# Patient Record
Sex: Male | Born: 1975
Health system: Southern US, Community
[De-identification: ages and names within clinical notes are randomized; demographics above are authoritative.]

## PROBLEM LIST (undated history)

## (undated) DIAGNOSIS — N200 Calculus of kidney: Secondary | ICD-10-CM

## (undated) DIAGNOSIS — G4730 Sleep apnea, unspecified: Secondary | ICD-10-CM

## (undated) DIAGNOSIS — F419 Anxiety disorder, unspecified: Secondary | ICD-10-CM

## (undated) HISTORY — DX: Anxiety disorder, unspecified: F41.9

## (undated) HISTORY — DX: Sleep apnea, unspecified: G47.30

## (undated) HISTORY — PX: ELBOW SURGERY: SHX618

---
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
## 1998-10-12 ENCOUNTER — Emergency Department (HOSPITAL_COMMUNITY): Admission: EM | Admit: 1998-10-12 | Discharge: 1998-10-12 | Payer: Self-pay | Admitting: Emergency Medicine

## 2000-03-28 ENCOUNTER — Emergency Department (HOSPITAL_COMMUNITY): Admission: EM | Admit: 2000-03-28 | Discharge: 2000-03-28 | Payer: Self-pay | Admitting: Emergency Medicine

## 2000-03-28 ENCOUNTER — Encounter: Payer: Self-pay | Admitting: Emergency Medicine

## 2000-11-01 ENCOUNTER — Encounter: Payer: Self-pay | Admitting: Gastroenterology

## 2000-11-01 ENCOUNTER — Encounter: Admission: RE | Admit: 2000-11-01 | Discharge: 2000-11-01 | Payer: Self-pay | Admitting: Gastroenterology

## 2001-07-18 ENCOUNTER — Emergency Department (HOSPITAL_COMMUNITY): Admission: EM | Admit: 2001-07-18 | Discharge: 2001-07-18 | Payer: Self-pay

## 2010-01-30 ENCOUNTER — Ambulatory Visit: Payer: Self-pay | Admitting: Diagnostic Radiology

## 2010-01-30 ENCOUNTER — Emergency Department (HOSPITAL_BASED_OUTPATIENT_CLINIC_OR_DEPARTMENT_OTHER): Admission: EM | Admit: 2010-01-30 | Discharge: 2010-01-30 | Payer: Self-pay | Admitting: Emergency Medicine

## 2011-03-24 ENCOUNTER — Other Ambulatory Visit: Payer: Self-pay | Admitting: Family Medicine

## 2011-03-24 DIAGNOSIS — M542 Cervicalgia: Secondary | ICD-10-CM

## 2011-03-25 ENCOUNTER — Ambulatory Visit
Admission: RE | Admit: 2011-03-25 | Discharge: 2011-03-25 | Disposition: A | Discharge status: Home / Self Care | Payer: 59 | Source: Ambulatory Visit | Attending: Family Medicine | Admitting: Family Medicine

## 2011-03-25 DIAGNOSIS — M542 Cervicalgia: Secondary | ICD-10-CM

## 2017-01-11 ENCOUNTER — Ambulatory Visit (INDEPENDENT_AMBULATORY_CARE_PROVIDER_SITE_OTHER): Payer: Commercial Managed Care - HMO | Admitting: Family Medicine

## 2017-01-11 ENCOUNTER — Encounter: Payer: Self-pay | Admitting: Family Medicine

## 2017-01-11 VITALS — BP 126/88 | HR 105 | Temp 97.4°F | Ht 67.5 in | Wt 198.0 lb

## 2017-01-11 DIAGNOSIS — R079 Chest pain, unspecified: Secondary | ICD-10-CM | POA: Diagnosis not present

## 2017-01-11 MED ORDER — PAROXETINE HCL 40 MG PO TABS: 40.0000 mg | ORAL_TABLET | Freq: Every day | ORAL | 3 refills | Status: DC

## 2017-01-11 NOTE — Progress Notes (Signed)
Subjective:  Patient ID: Isaac [name], male    DOB: 1976/10/22  Age: 41 y.o. MRN: [id number]  CC: New Patient (Initial Visit) (pt here today to establish care and c/o one episode of chest heaviness that lasted about 10 hours and then hasn't occured since then, but had SOB yesterday.)   HPI Isaac [name] presents for Two days ago had 8 hour episode of substernal chest pain. Described as 3/10. Pressure. No radiation to shoulder, neck or arm. . No nausea or dyspnea. 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. Job stress related to dysfunctional personality of coworker. Mild dyspnea intermitttently yesterday. Not related to exertion. Vague intermittent sensation in central chest today. Different from past episodes of heartburn.  History Isaac [name] has a past medical history of Anxiety.   He has a past surgical history that includes Wisdom tooth extraction (1995).   His family history includes Anxiety disorder in his daughter; Asthma in his brother and daughter; Diabetes in his father, paternal grandfather, and paternal grandmother; Hearing loss in his maternal grandfather; Heart disease in his father, paternal grandfather, and paternal grandmother; Hyperlipidemia in his father, paternal grandfather, and paternal grandmother; Hypertension in his father, paternal grandfather, and paternal grandmother.He reports that he quit smoking about 10 years ago. His smokeless tobacco use includes Chew. He reports that he does not drink alcohol or use drugs.  No current outpatient prescriptions on file prior to visit.   No current facility-administered medications on file prior to visit.     ROS Review of Systems  Constitutional: Negative for chills, diaphoresis, fever and unexpected weight change.  HENT: Negative for congestion, hearing loss, rhinorrhea and sore throat.   Eyes: Negative for visual disturbance.  Respiratory: Negative for cough and shortness of  breath.   Cardiovascular: Positive for chest pain.  Gastrointestinal: Negative for abdominal pain, constipation and diarrhea.  Genitourinary: Negative for dysuria and flank pain.  Musculoskeletal: Negative for arthralgias and joint swelling.  Skin: Negative for rash.  Neurological: Negative for dizziness and headaches.  Psychiatric/Behavioral: Negative for dysphoric mood and sleep disturbance.    Objective:  BP 126/88   Pulse (!) 105   Temp 97.4 F (36.3 C) (Oral)   Ht 5' 7.5" (1.715 m)   Wt 198 lb (89.8 kg)   BMI 30.55 kg/m   Physical Exam  Constitutional: He is oriented to person, place, and time. He appears well-developed and well-nourished. No distress.  HENT:  Head: Normocephalic and atraumatic.  Right Ear: External ear normal.  Left Ear: External ear normal.  Nose: Nose normal.  Mouth/Throat: Oropharynx is clear and moist.  Eyes: Conjunctivae and EOM are normal. Pupils are equal, round, and reactive to light.  Neck: Normal range of motion. Neck supple. No thyromegaly present.  Cardiovascular: Normal rate, regular rhythm and normal heart sounds.   No murmur heard. Pulmonary/Chest: Effort normal and breath sounds normal. No respiratory distress. He has no wheezes. He has no rales.  Abdominal: Soft. Bowel sounds are normal. He exhibits no distension. There is no tenderness.  Lymphadenopathy:    He has no cervical adenopathy.  Neurological: He is [name] and oriented to person, place, and time. He has normal reflexes.  Skin: Skin is warm and dry.  Psychiatric: He has a normal mood and affect. His behavior is normal. Judgment and thought content normal.    Assessment & Plan:   Isaac [name] was seen today for new patient (initial visit).  Diagnoses and all  orders for this visit:  Chest pain, unspecified type -     EKG 12-Lead -     CBC with Differential/Platelet -     CMP14+EGFR -     D-dimer, quantitative (not at [facility]) -     Lipid panel -     US Abdomen Complete;  Future -     ECHOCARDIOGRAM STRESS TEST; Future  Other orders -     PARoxetine (PAXIL) 40 MG tablet; Take 1 tablet (40 mg total) by mouth daily.   I have changed Isaac [name] PARoxetine.  Meds ordered this encounter  Medications  . DISCONTD: PARoxetine (PAXIL) 40 MG tablet    Sig: Take 40 mg by mouth daily.  Marland Kitchen PARoxetine (PAXIL) 40 MG tablet    Sig: Take 1 tablet (40 mg total) by mouth daily.    Dispense:  90 tablet    Refill:  3   EKG - NSR, no ischemia  Follow-up: Return in about 6 weeks (around 02/22/2017).  Claretta Fraise, M.D.

## 2017-01-12 LAB — CMP14+EGFR
ALBUMIN: 4.7 g/dL (ref 3.5–5.5)
ALT: 25 IU/L (ref 0–44)
AST: 21 IU/L (ref 0–40)
Albumin/Globulin Ratio: 1.9 (ref 1.2–2.2)
Alkaline Phosphatase: 88 IU/L (ref 39–117)
BUN / CREAT RATIO: 13 (ref 9–20)
BUN: 14 mg/dL (ref 6–24)
Bilirubin Total: 0.5 mg/dL (ref 0.0–1.2)
CALCIUM: 9.4 mg/dL (ref 8.7–10.2)
CO2: 24 mmol/L (ref 18–29)
CREATININE: 1.05 mg/dL (ref 0.76–1.27)
Chloride: 99 mmol/L (ref 96–106)
GFR calc Af Amer: 102 mL/min/{1.73_m2} (ref >59)
GFR, EST NON AFRICAN AMERICAN: 88 mL/min/{1.73_m2} (ref >59)
GLUCOSE: 74 mg/dL (ref 65–99)
Globulin, Total: 2.5 g/dL (ref 1.5–4.5)
Potassium: 4.8 mmol/L (ref 3.5–5.2)
Sodium: 141 mmol/L (ref 134–144)
Total Protein: 7.2 g/dL (ref 6.0–8.5)

## 2017-01-12 LAB — CBC WITH DIFFERENTIAL/PLATELET
BASOS ABS: 0.1 10*3/uL (ref 0.0–0.2)
Basos: 1 %
EOS (ABSOLUTE): 0.1 10*3/uL (ref 0.0–0.4)
EOS: 1 %
HEMATOCRIT: 45.5 % (ref 37.5–51.0)
HEMOGLOBIN: 15.8 g/dL (ref 13.0–17.7)
IMMATURE GRANULOCYTES: 0 %
Immature Grans (Abs): 0 10*3/uL (ref 0.0–0.1)
LYMPHS ABS: 2.2 10*3/uL (ref 0.7–3.1)
LYMPHS: 26 %
MCH: 30.5 pg (ref 26.6–33.0)
MCHC: 34.7 g/dL (ref 31.5–35.7)
MCV: 88 fL (ref 79–97)
MONOCYTES: 7 %
Monocytes Absolute: 0.6 10*3/uL (ref 0.1–0.9)
NEUTROS PCT: 65 %
Neutrophils Absolute: 5.4 10*3/uL (ref 1.4–7.0)
Platelets: 300 10*3/uL (ref 150–379)
RBC: 5.18 x10E6/uL (ref 4.14–5.80)
RDW: 13.1 % (ref 12.3–15.4)
WBC: 8.3 10*3/uL (ref 3.4–10.8)

## 2017-01-12 LAB — LIPID PANEL
CHOLESTEROL TOTAL: 214 mg/dL — AB (ref 100–199)
Chol/HDL Ratio: 6.5 ratio units — ABNORMAL HIGH (ref 0.0–5.0)
HDL: 33 mg/dL — AB (ref >39)
Triglycerides: 487 mg/dL — ABNORMAL HIGH (ref 0–149)

## 2017-01-12 LAB — D-DIMER, QUANTITATIVE (NOT AT ARMC): D-DIMER: 0.24 mg{FEU}/L (ref 0.00–0.49)

## 2017-01-13 ENCOUNTER — Telehealth: Payer: Self-pay | Admitting: Family Medicine

## 2017-01-13 DIAGNOSIS — R079 Chest pain, unspecified: Secondary | ICD-10-CM

## 2017-01-18 ENCOUNTER — Other Ambulatory Visit: Payer: Self-pay | Admitting: Family Medicine

## 2017-01-18 NOTE — Telephone Encounter (Signed)
There is an order in the system from Jan 31 for a stress echocardiogram. WS

## 2017-01-18 NOTE — Telephone Encounter (Signed)
Ca n you schedule this please?

## 2017-01-20 ENCOUNTER — Ambulatory Visit (HOSPITAL_COMMUNITY): Payer: 59

## 2017-01-31 ENCOUNTER — Ambulatory Visit: Payer: 59 | Admitting: Cardiology

## 2017-02-01 ENCOUNTER — Ambulatory Visit (INDEPENDENT_AMBULATORY_CARE_PROVIDER_SITE_OTHER): Payer: Commercial Managed Care - HMO | Admitting: Family Medicine

## 2017-02-01 ENCOUNTER — Encounter: Payer: Self-pay | Admitting: Family Medicine

## 2017-02-01 VITALS — BP 116/80 | HR 95 | Temp 98.5°F | Ht 67.5 in | Wt 201.0 lb

## 2017-02-01 DIAGNOSIS — R079 Chest pain, unspecified: Secondary | ICD-10-CM

## 2017-02-01 NOTE — Progress Notes (Signed)
Subjective:  Patient ID: Isaac [name], male    DOB: 1976-01-09  Age: 41 y.o. MRN: [id number]  CC: Follow-up (pt here today following up on chest pain. Pt states he hasn't had any episodes since he was seen last and no other concerns voiced at this time.)   HPI COLA HIGHFILL presents for Completely symptom-free since he was here last time. He still has some stressful colleagues at work. He had a great trip to [place] with his family. He admits to being impatient at times based on the long waits and lines of amusement parks. Is just not his style to do that fortunately there has been no chest pain, no palpitations, no dyspnea. No syncope. The abdomen has been benign as well. He decided against the gallbladder ultrasound. He is not having excessive flatulence or eructation.   History Hairo has a past medical history of Anxiety.   He has a past surgical history that includes Wisdom tooth extraction (1995).   His family history includes Anxiety disorder in his daughter; Asthma in his brother and daughter; Diabetes in his father, paternal grandfather, and paternal grandmother; Hearing loss in his maternal grandfather; Heart disease in his father, paternal grandfather, and paternal grandmother; Hyperlipidemia in his father, paternal grandfather, and paternal grandmother; Hypertension in his father, paternal grandfather, and paternal grandmother.He reports that he quit smoking about 10 years ago. His smokeless tobacco use includes Chew. He reports that he does not drink alcohol or use drugs.    ROS Review of Systems  Constitutional: Negative for chills, diaphoresis and fever.  HENT: Negative for rhinorrhea and sore throat.   Respiratory: Negative for cough and shortness of breath.   Cardiovascular: Negative for chest pain.  Gastrointestinal: Negative for abdominal pain.  Musculoskeletal: Negative for arthralgias and myalgias.  Skin: Negative for rash.  Neurological: Negative for weakness and  headaches.    Objective:  BP 116/80   Pulse 95   Temp 98.5 F (36.9 C) (Oral)   Ht 5' 7.5" (1.715 m)   Wt 201 lb (91.2 kg)   BMI 31.02 kg/m   BP Readings from Last 3 Encounters:  02/01/17 116/80  01/11/17 126/88    Wt Readings from Last 3 Encounters:  02/01/17 201 lb (91.2 kg)  01/11/17 198 lb (89.8 kg)     Physical Exam  Constitutional: He appears well-developed and well-nourished.  HENT:  Head: Normocephalic and atraumatic.  Right Ear: Tympanic membrane and external ear normal. No decreased hearing is noted.  Left Ear: Tympanic membrane and external ear normal. No decreased hearing is noted.  Mouth/Throat: No oropharyngeal exudate or posterior oropharyngeal erythema.  Eyes: Pupils are equal, round, and reactive to light.  Neck: Normal range of motion. Neck supple.  Cardiovascular: Normal rate and regular rhythm.   No murmur heard. Pulmonary/Chest: Breath sounds normal. No respiratory distress.  Abdominal: Soft. Bowel sounds are normal. He exhibits no mass. There is no tenderness.  Vitals reviewed.   Mr Cervical Spine Wo Contrast  Result Date: 03/25/2011 *RADIOLOGY REPORT* Clinical Data: Neck pain extending into both shoulders and into the right arm.  Numbness of the fourth and fifth fingers. MRI CERVICAL SPINE WITHOUT CONTRAST Technique:  Multiplanar and multiecho pulse sequences of the cervical spine, to include the craniocervFollow-up as needed for chest pain otherwise annually.ical junction and cervicothoracic junction, were obtained according to standard protocol without intravenous contrast. Comparison: None Findings: Alignment is normal.  There is no abnormality at the foramen magnum, C1-2, C2-3 or C3-4. At  C4-5, the disc bulges mildly.  The canal and foramina appear widely patent. At C5-6, the disc bulges mildly.  The canal and foramina appear widely patent. At C6-7, the disc bulges mildly.  The canal and foramina are widely patent. C7-T1 and T1-2 are normal. No  abnormal cord signal.  No focal osseous or articular pathology. IMPRESSION: No advanced disease or cause of the patient's described symptoms is identified.  Minimal non compressive disc bulges at C4-5, C5-6 and C6-7. Original Report Authenticated By: Thomasenia SalesMARK E. SHOGRY, M.D.   Assessment & Plan:   Duanne GuessDewey was seen today for follow-up.  Diagnoses and all orders for this visit:  Chest pain, unspecified type    I am having Mr. Katrinka BlazingSmith maintain his PARoxetine.  Allergies as of 02/01/2017   No Known Allergies     Medication List       Accurate as of 02/01/17 11:59 PM. Always use your most recent med list.          PARoxetine 40 MG tablet Commonly known as:  PAXIL Take 1 tablet (40 mg total) by mouth daily.        Follow-up: Return in about 1 year (around 02/01/2018).  Mechele ClaudeWarren Dalvin Clipper, M.D.

## 2017-02-10 ENCOUNTER — Encounter: Payer: Self-pay | Admitting: Cardiology

## 2017-02-10 ENCOUNTER — Ambulatory Visit (INDEPENDENT_AMBULATORY_CARE_PROVIDER_SITE_OTHER): Payer: Commercial Managed Care - HMO | Admitting: Cardiology

## 2017-02-10 VITALS — BP 116/96 | HR 95 | Ht 67.0 in | Wt 197.6 lb

## 2017-02-10 DIAGNOSIS — R079 Chest pain, unspecified: Secondary | ICD-10-CM | POA: Diagnosis not present

## 2017-02-10 NOTE — Patient Instructions (Signed)
Medication Instructions:  Your physician recommends that you continue on your current medications as directed. Please refer to the Current Medication list given to you today.  * If you need a refill on your cardiac medications before your next appointment, please call your pharmacy.   Labwork: None ordered  Testing/Procedures: None ordered  Follow-Up: No follow up is needed at this time with Dr. Camnitz.  He will see you on an as needed basis.   Thank you for choosing [facility]!!   Jaquis Picklesimer, RN [phone]     

## 2017-02-10 NOTE — Progress Notes (Signed)
Electrophysiology Office Note   Date:  02/10/2017   ID:  [name], DOB [birth year], MRN [id number]  PCP:  Mechele Claude, MD Primary Electrophysiologist:  Regan Lemming, MD    Chief Complaint  Patient presents with  . New Patient (Initial Visit)    chest pain     History of Present Illness: KAIRI TUFO is a 41 y.o. male who presents today for electrophysiology evaluation.   He presented to the primary physician at the end of January with chest pain. His chest heaviness lasted about 10 hours. It was 3 out of 10 without radiation of the shoulder neck or arm. In follow-up to his primary physician's office, he had not had any further episodes of chest pain. 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 He attributes this to a panic attack. He has not had any further episodes of shortness of breath or chest pain. He says that he is working outside on his land doing yard work, and chopping down trees. He is a well with that without any further issues.none   Today, he denies symptoms of palpitations, chest pain, shortness of breath, orthopnea, PND, lower extremity edema, claudication, dizziness, presyncope, syncope, bleeding, or neurologic sequela. The patient is tolerating medications without difficulties and is otherwise without complaint today.    Past Medical History:  Diagnosis Date  . Anxiety    Past Surgical History:  Procedure Laterality Date  . WISDOM TOOTH EXTRACTION  1995     Current Outpatient Prescriptions  Medication Sig Dispense Refill  . PARoxetine (PAXIL) 40 MG tablet Take 1 tablet (40 mg total) by mouth daily. 90 tablet 3   No current facility-administered medications for this visit.     Allergies:   Patient has no known allergies.   Social History:  The patient  reports that he quit smoking about 10 years ago. His smokeless tobacco use includes Chew. He reports  that he does not drink alcohol or use drugs.   Family History:  The patient's family history includes Anxiety disorder in his daughter; Asthma in his brother and daughter; Diabetes in his father, paternal grandfather, and paternal grandmother; Hearing loss in his maternal grandfather; Heart disease in his father, paternal grandfather, and paternal grandmother; Hyperlipidemia in his father, paternal grandfather, and paternal grandmother; Hypertension in his father, paternal grandfather, and paternal grandmother.    ROS:  Please see the history of present illness.   Otherwise, review of systems is positive for none.   All other systems are reviewed and negative.    PHYSICAL EXAM: VS:  BP (!) 116/96   Pulse 95   Ht 5\' 7"  (1.702 m)   Wt 197 lb 9.6 oz (89.6 kg)   BMI 30.95 kg/m  , BMI Body mass index is 30.95 kg/m. GEN: Well nourished, well developed, in no acute distress  HEENT: normal  Neck: no JVD, carotid bruits, or masses Cardiac: RRR; no murmurs, rubs, or gallops,no edema  Respiratory:  clear to auscultation bilaterally, normal work of breathing GI: soft, nontender, nondistended, + BS MS: no deformity or atrophy  Skin: warm and dry Neuro:  Strength and sensation are intact Psych: euthymic mood, full affect  EKG:  EKG is ordered today. Personal review of the ekg ordered shows sinus rhythm, rate 95  Recent Labs: 01/11/2017: ALT 25; BUN 14; Creatinine, Ser 1.05; Platelets 300; Potassium 4.8; Sodium 141  Lipid Panel     Component Value Date/Time   CHOL 214 (H) 01/11/2017 1750   TRIG 487 (H) 01/11/2017 1750   HDL 33 (L) 01/11/2017 1750   CHOLHDL 6.5 (H) 01/11/2017 1750   LDLCALC Comment 01/11/2017 1750     Wt Readings from Last 3 Encounters:  02/10/17 197 lb 9.6 oz (89.6 kg)  02/01/17 201 lb (91.2 kg)  01/11/17 198 lb (89.8 kg)      Other studies Reviewed: Additional studies/ records that were reviewed today include: PCP notes     ASSESSMENT AND PLAN:  1.   Chest pain: He is chest pain sounds atypical in nature. It was not associated with activity, and lasted multiple hours. He did have an episode of shortness of breath which she attributes to anxiety. He has exerted himself doing yard work, and chopping down trees which also did not cause any episodes of chest pain. Due to that, we'll not order further testing at this time. I have told him that if he develops chest pain again to call our office and we Patricie Geeslin order stress testing at that point.     Current medicines are reviewed at length with the patient today.   The patient does not have concerns regarding his medicines.  The following changes were made today:  none  Labs/ tests ordered today include:  Orders Placed This Encounter  Procedures  . EKG 12-Lead     Disposition:   FU with Conroy Goracke PRN  Signed, Kallen Delatorre Jorja LoaMartin Silvano Garofano, MD  02/10/2017 3:17 PM     [facility] [address] [place] [id number] [phone] (office) [phone] (fax)

## 2017-03-08 ENCOUNTER — Telehealth (HOSPITAL_COMMUNITY): Payer: Self-pay | Admitting: Family Medicine

## 2017-03-08 NOTE — Telephone Encounter (Signed)
Per office note by Lynnea FerrierWarren Pickard on 02/02/16  CC: Follow-up (pt here today following up on chest pain. Pt states he hasn't had any episodes since he was seen last and no other concerns voiced at this time.)

## 2017-03-18 ENCOUNTER — Emergency Department (HOSPITAL_BASED_OUTPATIENT_CLINIC_OR_DEPARTMENT_OTHER)
Admission: EM | Admit: 2017-03-18 | Discharge: 2017-03-18 | Disposition: A | Discharge status: Home / Self Care | Payer: Commercial Managed Care - HMO | Attending: Emergency Medicine | Admitting: Emergency Medicine

## 2017-03-18 ENCOUNTER — Emergency Department (HOSPITAL_BASED_OUTPATIENT_CLINIC_OR_DEPARTMENT_OTHER): Payer: Commercial Managed Care - HMO

## 2017-03-18 ENCOUNTER — Encounter (HOSPITAL_BASED_OUTPATIENT_CLINIC_OR_DEPARTMENT_OTHER): Payer: Self-pay | Admitting: *Deleted

## 2017-03-18 DIAGNOSIS — N201 Calculus of ureter: Secondary | ICD-10-CM | POA: Diagnosis not present

## 2017-03-18 DIAGNOSIS — N200 Calculus of kidney: Secondary | ICD-10-CM

## 2017-03-18 DIAGNOSIS — R109 Unspecified abdominal pain: Secondary | ICD-10-CM | POA: Diagnosis not present

## 2017-03-18 DIAGNOSIS — Z79899 Other long term (current) drug therapy: Secondary | ICD-10-CM | POA: Diagnosis not present

## 2017-03-18 DIAGNOSIS — F17220 Nicotine dependence, chewing tobacco, uncomplicated: Secondary | ICD-10-CM | POA: Insufficient documentation

## 2017-03-18 HISTORY — DX: Calculus of kidney: N20.0

## 2017-03-18 LAB — CBC WITH DIFFERENTIAL/PLATELET
Basophils Absolute: 0.1 10*3/uL (ref 0.0–0.1)
Basophils Relative: 1 %
EOS PCT: 1 %
Eosinophils Absolute: 0.1 10*3/uL (ref 0.0–0.7)
HCT: 45.6 % (ref 39.0–52.0)
Hemoglobin: 15.8 g/dL (ref 13.0–17.0)
LYMPHS ABS: 2.3 10*3/uL (ref 0.7–4.0)
LYMPHS PCT: 28 %
MCH: 30.7 pg (ref 26.0–34.0)
MCHC: 34.6 g/dL (ref 30.0–36.0)
MCV: 88.5 fL (ref 78.0–100.0)
MONOS PCT: 7 %
Monocytes Absolute: 0.6 10*3/uL (ref 0.1–1.0)
Neutro Abs: 5.1 10*3/uL (ref 1.7–7.7)
Neutrophils Relative %: 63 %
PLATELETS: 295 10*3/uL (ref 150–400)
RBC: 5.15 MIL/uL (ref 4.22–5.81)
RDW: 12.5 % (ref 11.5–15.5)
WBC: 8.2 10*3/uL (ref 4.0–10.5)

## 2017-03-18 LAB — URINALYSIS, ROUTINE W REFLEX MICROSCOPIC
Bilirubin Urine: NEGATIVE
GLUCOSE, UA: NEGATIVE mg/dL
Ketones, ur: NEGATIVE mg/dL
Leukocytes, UA: NEGATIVE
Nitrite: NEGATIVE
Protein, ur: 30 mg/dL — AB
SPECIFIC GRAVITY, URINE: 1.022 (ref 1.005–1.030)
pH: 6 (ref 5.0–8.0)

## 2017-03-18 LAB — BASIC METABOLIC PANEL
Anion gap: 12 (ref 5–15)
BUN: 15 mg/dL (ref 6–20)
CHLORIDE: 101 mmol/L (ref 101–111)
CO2: 26 mmol/L (ref 22–32)
Calcium: 9.3 mg/dL (ref 8.9–10.3)
Creatinine, Ser: 1.09 mg/dL (ref 0.61–1.24)
GFR calc Af Amer: >60 mL/min (ref >60)
GLUCOSE: 125 mg/dL — AB (ref 65–99)
POTASSIUM: 3.7 mmol/L (ref 3.5–5.1)
Sodium: 139 mmol/L (ref 135–145)

## 2017-03-18 LAB — URINALYSIS, MICROSCOPIC (REFLEX)

## 2017-03-18 MED ORDER — MORPHINE SULFATE (PF) 2 MG/ML IV SOLN
INTRAVENOUS | Status: AC
  Administered 2017-03-18: 2 mg via INTRAVENOUS
  Filled 2017-03-18: qty 1

## 2017-03-18 MED ORDER — ONDANSETRON 4 MG PO TBDP: ORAL_TABLET | ORAL | 0 refills | Status: DC

## 2017-03-18 MED ORDER — TAMSULOSIN HCL 0.4 MG PO CAPS: 0.4000 mg | ORAL_CAPSULE | Freq: Every day | ORAL | 0 refills | Status: DC

## 2017-03-18 MED ORDER — KETOROLAC TROMETHAMINE 30 MG/ML IJ SOLN
INTRAMUSCULAR | Status: AC
  Administered 2017-03-18: 30 mg
  Filled 2017-03-18: qty 1

## 2017-03-18 MED ORDER — MORPHINE SULFATE (PF) 4 MG/ML IV SOLN
INTRAVENOUS | Status: AC
Start: 2017-03-18 — End: 2017-03-18
  Administered 2017-03-18: 4 mg
  Filled 2017-03-18: qty 1

## 2017-03-18 MED ORDER — OXYCODONE-ACETAMINOPHEN 5-325 MG PO TABS: 1.0000 | ORAL_TABLET | ORAL | 0 refills | Status: DC | PRN

## 2017-03-18 MED ORDER — ONDANSETRON HCL 4 MG/2ML IJ SOLN
INTRAMUSCULAR | Status: AC
  Filled 2017-03-18: qty 2

## 2017-03-18 MED ORDER — ONDANSETRON HCL 4 MG/2ML IJ SOLN
4.0000 mg | Freq: Once | INTRAMUSCULAR | Status: AC
  Administered 2017-03-18: 4 mg via INTRAVENOUS

## 2017-03-18 NOTE — ED Triage Notes (Signed)
Pt reports sudden onset of L flank pain around 1600 (hx of kidney stones). Also reports n/v (approx 6 episodes). Denies known fever.

## 2017-03-18 NOTE — ED Notes (Signed)
Pt has hx of kidney stone and states this feels the same. Pain left flank onset 1 hr ago. Pain radiates to LLQ. +nausea. Vomited x 1.

## 2017-03-18 NOTE — ED Provider Notes (Signed)
[facility] Provider Note   CSN: [id number] Arrival date & time: 03/18/17  1633  By signing my name below, I, Nelwyn Salisbury, attest that this documentation has been prepared under the direction and in the presence of Rolan Bucco, MD . Electronically Signed: Nelwyn Salisbury, Scribe. 03/18/2017. 5:10 PM.  History   Chief Complaint Chief Complaint  Patient presents with  . Flank Pain   The history is provided by the patient and the spouse. No language interpreter was used.    HPI Comments:  Isaac [name] is a 41 y.o. male with pmhx of kidney stones who presents to the Emergency Department complaining of constant, moderate, left-flank pain onset 1 hour. Pt's wife states that the pt was complaining of pain that radiated into his abdomen and around to his back. Pt actively vomiting in room. He has not tried anything PTA for his symptoms. Denies any fever or difficulty urinating. Pt's last kidney stone was several years ago, and he typically passes them on his own.   Past Medical History:  Diagnosis Date  . Anxiety   . Kidney stones     There are no active problems to display for this patient.   Past Surgical History:  Procedure Laterality Date  . WISDOM TOOTH EXTRACTION  1995       Home Medications    Prior to Admission medications   Medication Sig Start Date End Date Taking? Authorizing Provider  ondansetron (ZOFRAN ODT) 4 MG disintegrating tablet  ODT q4 hours prn nausea/vomit 03/18/17   Rolan Bucco, MD  oxyCODONE-acetaminophen (PERCOCET) 5-325 MG tablet Take 1-2 tablets by mouth every 4 (four) hours as needed. 03/18/17   Rolan Bucco, MD  PARoxetine (PAXIL) 40 MG tablet Take 1 tablet (40 mg total) by mouth daily. 01/11/17   Mechele Claude, MD  tamsulosin (FLOMAX) 0.4 MG CAPS capsule Take 1 capsule (0.4 mg total) by mouth daily. 03/18/17   Rolan Bucco, MD    Family History Family History  Problem Relation Age of Onset  . Heart disease Father   . Hyperlipidemia  Father   . Diabetes Father   . Hypertension Father   . Asthma Brother   . Asthma Daughter   . Anxiety disorder Daughter   . Hearing loss Maternal Grandfather   . Diabetes Paternal Grandmother   . Heart disease Paternal Grandmother   . Hyperlipidemia Paternal Grandmother   . Hypertension Paternal Grandmother   . Heart disease Paternal Grandfather   . Hyperlipidemia Paternal Grandfather   . Diabetes Paternal Grandfather   . Hypertension Paternal Grandfather     Social History Social History  Substance Use Topics  . Smoking status: Former Smoker    Quit date: 01/11/2007  . Smokeless tobacco: Current User    Types: Chew  . Alcohol use No     Allergies   Patient has no known allergies.   Review of Systems Review of Systems  Constitutional: Negative for chills, diaphoresis, fatigue and fever.  HENT: Negative for congestion, rhinorrhea and sneezing.   Eyes: Negative.   Respiratory: Negative for cough, chest tightness and shortness of breath.   Cardiovascular: Negative for chest pain and leg swelling.  Gastrointestinal: Positive for abdominal pain, nausea and vomiting. Negative for blood in stool and diarrhea.  Genitourinary: Positive for flank pain. Negative for difficulty urinating, frequency and hematuria.  Musculoskeletal: Positive for back pain. Negative for arthralgias.  Skin: Negative for rash.  Neurological: Negative for dizziness, speech difficulty, weakness, numbness and headaches.  Physical Exam Updated Vital Signs BP (!) 128/95 (BP Location: Right Arm)   Pulse 96   Temp 97.4 F (36.3 C) (Oral)   Resp 20   Ht  (1.702 m)   Wt 200 lb (90.7 kg)   SpO2 99%   BMI 31.32 kg/m   Physical Exam  Constitutional: He is oriented to person, place, and time. He appears well-developed and well-nourished. He appears distressed.  HENT:  Head: Normocephalic and atraumatic.  Eyes: Pupils are equal, round, and reactive to light.  Neck: Normal range of motion.  Neck supple.  Cardiovascular: Normal rate, regular rhythm and normal heart sounds.   Pulmonary/Chest: Effort normal and breath sounds normal. No respiratory distress. He has no wheezes. He has no rales. He exhibits no tenderness.  Abdominal: Soft. Bowel sounds are normal. There is no tenderness. There is no rebound and no guarding.  Pain in left flank and mid abdomen  Musculoskeletal: Normal range of motion. He exhibits no edema.  Lymphadenopathy:    He has no cervical adenopathy.  Neurological: He is alert and oriented to person, place, and time.  Skin: Skin is warm. No rash noted. He is diaphoretic.  Psychiatric: He has a normal mood and affect.     ED Treatments / Results  DIAGNOSTIC STUDIES:  Oxygen Saturation is 100% on RA, normal by my interpretation.    COORDINATION OF CARE:  5:16 PM Discussed treatment plan with pt at bedside which includes pain medication and imaging and pt agreed to plan.  Labs (all labs ordered are listed, but only abnormal results are displayed) Labs Reviewed  BASIC METABOLIC PANEL - Abnormal; Notable for the following:       Result Value   Glucose, Bld 125 (*)    All other components within normal limits  URINALYSIS, ROUTINE W REFLEX MICROSCOPIC - Abnormal; Notable for the following:    Color, Urine BROWN (*)    APPearance CLOUDY (*)    Hgb urine dipstick LARGE (*)    Protein, ur 30 (*)    All other components within normal limits  URINALYSIS, MICROSCOPIC (REFLEX) - Abnormal; Notable for the following:    Bacteria, UA MANY (*)    Squamous Epithelial / LPF 0-5 (*)    All other components within normal limits  CBC WITH DIFFERENTIAL/PLATELET    EKG  EKG Interpretation None       Radiology Dg Abdomen 1 View  Result Date: 03/18/2017 CLINICAL DATA:  Acute onset left flank pain x 2 hrs, around into abd. Hx renal stones. EXAM: ABDOMEN - 1 VIEW COMPARISON:  None. FINDINGS: Mildly prominent small bowel loops are seen within the left abdomen,  upper normal in caliber at approximately 3 cm diameter. Overall bowel gas pattern is otherwise nonobstructive. No evidence of soft tissue mass or abnormal fluid collection. No evidence of free intraperitoneal air. 5 mm calcification is positioned just to the left of the L3 vertebral body, highly suspicious for ureteral stone. IMPRESSION: 5 mm calcification just to the left of the L3 vertebral body, highly suspicious for ureteral stone. Consider noncontrast abdomen and pelvis CT for confirmation and to exclude a significant hydronephrosis. Electronically Signed   By: Bary Richard M.D.   On: 03/18/2017 18:02    Procedures Procedures (including critical care time)  Medications Ordered in ED Medications  ondansetron (ZOFRAN) injection 4 mg (4 mg Intravenous Given 03/18/17 1654)  morphine 4 MG/ML injection (4 mg  Given 03/18/17 1723)  morphine 2 MG/ML injection (2 mg Intravenous  Given 03/18/17 1729)  ketorolac (TORADOL) 30 MG/ML injection (30 mg  Given 03/18/17 1719)     Initial Impression / Assessment and Plan / ED Course  I have reviewed the triage vital signs and the nursing notes.  Pertinent labs & imaging results that were available during my care of the patient were reviewed by me and considered in my medical decision making (see chart for details).     Patient presents a left flank pain is consistent with his prior kidney stones. He was given Toradol and morphine in the ED and feels much better. His pain is completely controlled. There is no evidence of a UTI. KUB shows what appears to be a 5 mm stone. I don't feel this point any needs a CT scan. He's passed his prior stones without intervention. His creatinine is normal. He was discharged home in good condition. He was started on Percocet, Zofran and Flomax. He was also advised to use ibuprofen. He will follow-up with Dr. Hillis Range, his urologist, with [facility]. Return precautions were given.  Final Clinical Impressions(s) / ED Diagnoses     Final diagnoses:  Kidney stone    New Prescriptions New Prescriptions   ONDANSETRON (ZOFRAN ODT) 4 MG DISINTEGRATING TABLET     ODT q4 hours prn nausea/vomit   OXYCODONE-ACETAMINOPHEN (PERCOCET) 5-325 MG TABLET    Take 1-2 tablets by mouth every 4 (four) hours as needed.   TAMSULOSIN (FLOMAX) 0.4 MG CAPS CAPSULE    Take 1 capsule (0.4 mg total) by mouth daily.  I personally performed the services described in this documentation, which was scribed in my presence.  The recorded information has been reviewed and considered.     Rolan Bucco, MD 03/18/17 [phone]

## 2017-03-18 NOTE — ED Notes (Signed)
ED Provider at bedside. 

## 2017-03-18 NOTE — ED Notes (Signed)
Patient denies pain and is resting comfortably.  

## 2017-03-24 DIAGNOSIS — N201 Calculus of ureter: Secondary | ICD-10-CM | POA: Diagnosis not present

## 2017-04-04 ENCOUNTER — Telehealth: Payer: Self-pay

## 2017-04-05 DIAGNOSIS — N201 Calculus of ureter: Secondary | ICD-10-CM | POA: Diagnosis not present

## 2017-04-10 NOTE — Telephone Encounter (Signed)
x

## 2017-04-11 ENCOUNTER — Other Ambulatory Visit: Payer: Self-pay | Admitting: Urology

## 2017-04-12 ENCOUNTER — Encounter (HOSPITAL_COMMUNITY): Payer: Self-pay | Admitting: *Deleted

## 2017-04-12 NOTE — H&P (Signed)
Office Visit Report 04/05/2017    Isaac [name]         MRN: [id number]  PRIMARY CARE:  Mechele Claude, MD  DOB: 1976/02/02, 41 year old Male  REFERRING:      PROVIDER:  Wilkie Aye, M.D.    TREATING:  Anne Fu    LOCATION:  [facility] [phone]    CC: I have kidney stones.  HPI: Isaac [name] is a 41 year-old male established patient who is here for renal calculi.  03/24/2017: He presented to the ER 1 week ago with sharp, constant, severe, nonradiating left flank pain and was diagnosed with a 4mm left proximal ureteral calculus on KUB. He was given an rx for flomax. The pain resolved 2-3 days ago. No worsening LUTS.   04/05/17: Denies passage of stone. Now having more intermittent lower abdominal pain radiating into the testicle. He also notes increasing urine frequency and hesitancy.   The problem is on the left side. He first stated noticing pain on approximately 03/17/2017. This is not his first kidney stone. His first stone was approximately 03/12/1997. He has had 4 stones prior to getting this one. He is not currently having flank pain, back pain, groin pain, nausea, vomiting, fever or chills. He has not caught a stone in his urine strainer since his symptoms began.   He has never had surgical treatment for calculi in the past.     ALLERGIES: No Allergies    MEDICATIONS: Claritin 10 mg capsule  Paroxetine Hcl 20 mg tablet Oral     GU PSH: Vasectomy - 2008      PSH Notes: Surgery Of Male Genitalia Vasectomy   NON-GU PSH: No Non-GU PSH        GU PMH: Ureteral calculus - 03/24/2017 Renal calculus, Nephrolithiasis - 2014    NON-GU PMH: Anxiety, Anxiety - 2014 Personal history of other specified conditions, History of heartburn - 2014 Encounter for general adult medical examination without abnormal findings, Encounter for preventive health examination    FAMILY HISTORY: Family Health Status Number - Uncle Heart Disease - Uncle, Grandfather,  Grandmother, Father nephrolithiasis - Father   SOCIAL HISTORY: Marital Status: Married Current Smoking Status: Patient does not smoke anymore. Has not smoked since 03/13/2003.  <DIV'  Tobacco Use Assessment Completed:  Used Tobacco in last 30 days?   Social Drinker.  Drinks 3 caffeinated drinks per day.     Notes: Caffeine Use, Alcohol Use, Marital History - Currently Married, Occupation:, Tobacco Use   REVIEW OF SYSTEMS:     GU Review Male:  Patient reports frequent urination and trouble starting your stream. Patient denies hard to postpone urination, burning/ pain with urination, get up at night to urinate, leakage of urine, stream starts and stops, have to strain to urinate , erection problems, and penile pain.    Gastrointestinal (Upper):  Patient reports nausea and vomiting. Patient denies indigestion/ heartburn.    Gastrointestinal (Lower):  Patient denies diarrhea and constipation.    Constitutional:  Patient denies fever, night sweats, weight loss, and fatigue.    Skin:  Patient denies skin rash/ lesion and itching.    Eyes:  Patient denies blurred vision and double vision.    Ears/ Nose/ Throat:  Patient denies sore throat and sinus problems.    Hematologic/Lymphatic:  Patient denies easy bruising and swollen glands.    Cardiovascular:  Patient denies leg swelling and chest pains.    Respiratory:  Patient denies  cough and shortness of breath.    Endocrine:  Patient denies excessive thirst.    Musculoskeletal:  Patient reports back pain. Patient denies joint pain.    Neurological:  Patient denies headaches and dizziness.    Psychologic:  Patient denies depression and anxiety.    VITAL SIGNS:       04/05/2017 03:58 PM     BP 119/87 mmHg     Pulse 109 /min     Temperature 98.6 F / 37 C     MULTI-SYSTEM PHYSICAL EXAMINATION:      Constitutional: Well-nourished. No physical deformities. Normally developed. Good grooming.     Skin: No paleness, no jaundice, no cyanosis. No lesion,  no ulcer, no rash.     Neurologic / Psychiatric: Oriented to time, oriented to place, oriented to person. No depression, no anxiety, no agitation.     Gastrointestinal: No mass, no tenderness, no rigidity, non obese abdomen. No flank or suprapubic pain.     Musculoskeletal: Spine, ribs, pelvis no bilateral tenderness. Normal gait and station of head and neck.            PAST DATA REVIEWED:   Source Of History:  Patient  Records Review:  Previous Patient Records  Urine Test Review:  Urinalysis  X-Ray Review: KUB: Reviewed Films.      04/05/17  Urinalysis  Urine Appearance Clear   Urine Color Yellow   Urine Glucose Neg   Urine Bilirubin Neg   Urine Ketones Neg   Urine Specific Gravity 1.010   Urine Blood Trace   Urine pH 6.5   Urine Protein Neg   Urine Urobilinogen 0.2   Urine Nitrites Neg   Urine Leukocyte Esterase Neg   Urine WBC/hpf 0 - 5/hpf   Urine RBC/hpf 0 - 2/hpf   Urine Epithelial Cells NS (Not Seen)   Urine Bacteria NS (Not Seen)   Urine Mucous Not Present   Urine Yeast NS (Not Seen)   Urine Trichomonas Not Present   Urine Cystals NS (Not Seen)   Urine Casts NS (Not Seen)   Urine Sperm Not Present    PROCEDURES:    KUB - 81191  A single view of the abdomen is obtained.  Fecal Stasis:  Mild fecal stasis.  Calculi:  Distal left ureter calculi. Left Ureteral calcification size: 5mm      Stone previously noted in the proximal left ureter is now noted in the pelvic rim or anatomical expected track of the distal left ureter.    Urinalysis w/Scope  Dipstick Dipstick Cont'd Micro  Color: Yellow Bilirubin: Neg WBC/hpf: 0 - 5/hpf  Appearance: Clear Ketones: Neg RBC/hpf: 0 - 2/hpf  Specific Gravity: 1.010 Blood: Trace Bacteria: NS (Not Seen)  pH: 6.5 Protein: Neg Cystals: NS (Not Seen)  Glucose: Neg Urobilinogen: 0.2 Casts: NS (Not Seen)   Nitrites: Neg Trichomonas: Not Present   Leukocyte Esterase: Neg Mucous: Not Present    Epithelial Cells: NS (Not Seen)     Yeast: NS (Not Seen)    Sperm: Not Present    ASSESSMENT:     ICD-10 Details  1 GU:  Ureteral calculus - N20.1    PLAN:   Medications  New Meds: Ketorolac Tromethamine 10 mg tablet 1 tablet PO Q 8 H PRN #10 0 Refill(s)  Meloxicam 15 mg tablet 1 tablet PO Daily #30 0 Refill(s)  Ondansetron Odt 4 mg tablet,disintegrating 1 tablet PO Q 6 H PRN #20 0 Refill(s)    Orders  Labs Urine Culture  X-Ray Notes: ...  Schedule  X-Rays: 2 Weeks - KUB  Return Visit/Planned Activity: 2 Weeks - Office Visit  Document  Letter(s):  Created for Patient: Clinical Summary   Notes:  Discussion held regarding continuing medical expulsion therapy. Stone has progressed into the distal left ureter now. Recommended continuing tamsulosin on a consistent basis instead of intermittently. I also provided meloxicam to be taken daily, a refill of his nausea medicine, and Toradol to have on hand for acute colic at work should that arise so he would potentially not have to miss additional time because of taking Percocet. We will continue medical expulsion therapy for an additional 2 weeks. Patient to follow-up with me then with x-ray. If still symptomatic with no stone passage, I'll get him set up for lithotripsy.   Signed by Anne Fu on 04/05/17 at 4:52 PM (EDT)  The information contained in this medical record document is considered private and confidential patient information. This information can only be used for the medical diagnosis and/or medical services that are being provided by the patient's selected caregivers. This information can only be distributed outside of the patient's care if the patient agrees and signs waivers of authorization for this information to be sent to an outside source or route.

## 2017-04-13 ENCOUNTER — Encounter (HOSPITAL_COMMUNITY)
Admission: RE | Disposition: A | Discharge status: Home / Self Care | Payer: Self-pay | Source: Ambulatory Visit | Attending: Urology

## 2017-04-13 ENCOUNTER — Ambulatory Visit (HOSPITAL_COMMUNITY)
Admission: RE | Admit: 2017-04-13 | Discharge: 2017-04-13 | Disposition: A | Discharge status: Home / Self Care | Payer: Commercial Managed Care - HMO | Source: Ambulatory Visit | Attending: Urology | Admitting: Urology

## 2017-04-13 ENCOUNTER — Encounter (HOSPITAL_COMMUNITY): Payer: Self-pay | Admitting: *Deleted

## 2017-04-13 ENCOUNTER — Ambulatory Visit (HOSPITAL_COMMUNITY): Payer: Commercial Managed Care - HMO

## 2017-04-13 DIAGNOSIS — N201 Calculus of ureter: Secondary | ICD-10-CM | POA: Diagnosis present

## 2017-04-13 DIAGNOSIS — Z79899 Other long term (current) drug therapy: Secondary | ICD-10-CM | POA: Diagnosis not present

## 2017-04-13 DIAGNOSIS — Z87891 Personal history of nicotine dependence: Secondary | ICD-10-CM | POA: Insufficient documentation

## 2017-04-13 HISTORY — PX: EXTRACORPOREAL SHOCK WAVE LITHOTRIPSY: SHX1557

## 2017-04-13 SURGERY — LITHOTRIPSY, ESWL
Anesthesia: LOCAL | Laterality: Left

## 2017-04-13 MED ORDER — SODIUM CHLORIDE 0.9 % IV SOLN
INTRAVENOUS | Status: DC
  Administered 2017-04-13: 09:00:00 via INTRAVENOUS

## 2017-04-13 MED ORDER — DIAZEPAM 5 MG PO TABS
10.0000 mg | ORAL_TABLET | ORAL | Status: AC
  Administered 2017-04-13: 10 mg via ORAL
  Filled 2017-04-13: qty 2

## 2017-04-13 MED ORDER — CIPROFLOXACIN HCL 500 MG PO TABS
500.0000 mg | ORAL_TABLET | ORAL | Status: AC
  Administered 2017-04-13: 500 mg via ORAL
  Filled 2017-04-13: qty 1

## 2017-04-13 MED ORDER — DIPHENHYDRAMINE HCL 25 MG PO CAPS
25.0000 mg | ORAL_CAPSULE | ORAL | Status: AC
  Administered 2017-04-13: 25 mg via ORAL
  Filled 2017-04-13: qty 1

## 2017-04-13 NOTE — Interval H&P Note (Signed)
History and Physical Interval Note:  04/13/2017 9:53 AM  Isaac [name]  has presented today for surgery, with the diagnosis of LEFT URETERAL STONE  The various methods of treatment have been discussed with the patient and family. After consideration of risks, benefits and other options for treatment, the patient has consented to  Procedure(s): LEFT EXTRACORPOREAL SHOCK WAVE LITHOTRIPSY (ESWL) (Left) as a surgical intervention .  The patient's history has been reviewed, patient examined, no change in status, stable for surgery.  I have reviewed the patient's chart and labs.  Questions were answered to the patient's satisfaction.     Calab Sachse I [name]

## 2017-04-13 NOTE — Discharge Instructions (Signed)
Dietary Guidelines to Help Prevent Kidney Stones Kidney stones are deposits of minerals and salts that form inside your kidneys. Your risk of developing kidney stones may be greater depending on your diet, your lifestyle, the medicines you take, and whether you have certain medical conditions. Most people can reduce their chances of developing kidney stones by following the instructions below. Depending on your overall health and the type of kidney stones you tend to develop, your dietitian may give you more specific instructions. What are tips for following this plan? Reading food labels   Choose foods with "no salt added" or "low-salt" labels. Limit your sodium intake to less than 1500 mg per day.  Choose foods with calcium for each meal and snack. Try to eat about 300 mg of calcium at each meal. Foods that contain 200-500 mg of calcium per serving include:  8 oz (237 ml) of milk, fortified nondairy milk, and fortified fruit juice.  8 oz (237 ml) of kefir, yogurt, and soy yogurt.  4 oz (118 ml) of tofu.  1 oz of cheese.  1 cup (300 g) of dried figs.  1 cup (91 g) of cooked broccoli.  1-3 oz can of sardines or mackerel.  Most people need 1000 to 1500 mg of calcium each day. Talk to your dietitian about how much calcium is recommended for you. Shopping   Buy plenty of fresh fruits and vegetables. Most people do not need to avoid fruits and vegetables, even if they contain nutrients that may contribute to kidney stones.  When shopping for convenience foods, choose:  Whole pieces of fruit.  Premade salads with dressing on the side.  Low-fat fruit and yogurt smoothies.  Avoid buying frozen meals or prepared deli foods.  Look for foods with live cultures, such as yogurt and kefir. Cooking   Do not add salt to food when cooking. Place a salt shaker on the table and allow each person to add his or her own salt to taste.  Use vegetable protein, such as beans, textured vegetable  protein (TVP), or tofu instead of meat in pasta, casseroles, and soups. Meal planning   Eat less salt, if told by your dietitian. To do this:  Avoid eating processed or premade food.  Avoid eating fast food.  Eat less animal protein, including cheese, meat, poultry, or fish, if told by your dietitian. To do this:  Limit the number of times you have meat, poultry, fish, or cheese each week. Eat a diet free of meat at least 2 days a week.  Eat only one serving each day of meat, poultry, fish, or seafood.  When you prepare animal protein, cut pieces into small portion sizes. For most meat and fish, one serving is about the size of one deck of cards.  Eat at least 5 servings of fresh fruits and vegetables each day. To do this:  Keep fruits and vegetables on hand for snacks.  Eat 1 piece of fruit or a handful of berries with breakfast.  Have a salad and fruit at lunch.  Have two kinds of vegetables at dinner.  Limit foods that are high in a substance called oxalate. These include:  Spinach.  Rhubarb.  Beets.  Potato chips and french fries.  Nuts.  If you regularly take a diuretic medicine, make sure to eat at least 1-2 fruits or vegetables high in potassium each day. These include:  Avocado.  Banana.  Orange, prune, carrot, or tomato juice.  Baked potato.  Cabbage.    Beans and split peas. General instructions   Drink enough fluid to keep your urine clear or pale yellow. This is the most important thing you can do.  Talk to your health care provider and dietitian about taking daily supplements. Depending on your health and the cause of your kidney stones, you may be advised:  Not to take supplements with vitamin C.  To take a calcium supplement.  To take a daily probiotic supplement.  To take other supplements such as magnesium, fish oil, or vitamin B6.  Take all medicines and supplements as told by your health care provider.  Limit alcohol intake to no  more than 1 drink a day for nonpregnant women and 2 drinks a day for men. One drink equals 12 oz of beer, 5 oz of wine, or 1 oz of hard liquor.  Lose weight if told by your health care provider. Work with your dietitian to find strategies and an eating plan that works best for you. What foods are not recommended? Limit your intake of the following foods, or as told by your dietitian. Talk to your dietitian about specific foods you should avoid based on the type of kidney stones and your overall health. Grains  Breads. Bagels. Rolls. Baked goods. Salted crackers. Cereal. Pasta. Vegetables  Spinach. Rhubarb. Beets. Canned vegetables. Pickles. Olives. Meats and other protein foods  Nuts. Nut butters. Large portions of meat, poultry, or fish. Salted or cured meats. Deli meats. Hot dogs. Sausages. Dairy  Cheese. Beverages  Regular soft drinks. Regular vegetable juice. Seasonings and other foods  Seasoning blends with salt. Salad dressings. Canned soups. Soy sauce. Ketchup. Barbecue sauce. Canned pasta sauce. Casseroles. Pizza. Lasagna. Frozen meals. Potato chips. French fries. Summary  You can reduce your risk of kidney stones by making changes to your diet.  The most important thing you can do is drink enough fluid. You should drink enough fluid to keep your urine clear or pale yellow.  Ask your health care provider or dietitian how much protein from animal sources you should eat each day, and also how much salt and calcium you should have each day. This information is not intended to replace advice given to you by your health care provider. Make sure you discuss any questions you have with your health care provider. Document Released: 03/25/2011 Document Revised: 11/08/2016 Document Reviewed: 11/08/2016 Elsevier Interactive Patient Education  2017 Elsevier Inc.  

## 2017-04-14 ENCOUNTER — Encounter (HOSPITAL_COMMUNITY): Payer: Self-pay | Admitting: Urology

## 2017-04-27 DIAGNOSIS — N201 Calculus of ureter: Secondary | ICD-10-CM | POA: Diagnosis not present

## 2017-05-29 DIAGNOSIS — N201 Calculus of ureter: Secondary | ICD-10-CM | POA: Diagnosis not present

## 2017-12-18 ENCOUNTER — Encounter: Payer: Self-pay | Admitting: Family Medicine

## 2017-12-18 ENCOUNTER — Ambulatory Visit: Payer: 59 | Admitting: Family Medicine

## 2017-12-18 VITALS — BP 139/100 | HR 117 | Temp 98.2°F | Ht 67.0 in | Wt 195.0 lb

## 2017-12-18 DIAGNOSIS — J019 Acute sinusitis, unspecified: Secondary | ICD-10-CM | POA: Diagnosis not present

## 2017-12-18 DIAGNOSIS — R52 Pain, unspecified: Secondary | ICD-10-CM | POA: Diagnosis not present

## 2017-12-18 MED ORDER — HYDROCODONE-HOMATROPINE 5-1.5 MG/5ML PO SYRP: 5.0000 mL | ORAL_SOLUTION | Freq: Four times a day (QID) | ORAL | 0 refills | Status: DC | PRN

## 2017-12-18 MED ORDER — PAROXETINE HCL 40 MG PO TABS: 40.0000 mg | ORAL_TABLET | Freq: Every day | ORAL | 3 refills | Status: DC

## 2017-12-18 MED ORDER — AMOXICILLIN-POT CLAVULANATE 875-125 MG PO TABS: 1.0000 | ORAL_TABLET | Freq: Two times a day (BID) | ORAL | 0 refills | Status: DC

## 2017-12-18 NOTE — Patient Instructions (Signed)
Great to see you!  Gets most likely that you have the influenza, however your testing is negative today.  Day 5 he should begin to see improvement without specific treatment for influenza within 2-3 days.  Because of your sinus pain and because of how ill your appear today I've recommended that you begin a course of Augmentin.   Sinusitis, Adult Sinusitis is soreness and inflammation of your sinuses. Sinuses are hollow spaces in the bones around your face. They are located:  Around your eyes.  In the middle of your forehead.  Behind your nose.  In your cheekbones.  Your sinuses and nasal passages are lined with a stringy fluid (mucus). Mucus normally drains out of your sinuses. When your nasal tissues get inflamed or swollen, the mucus can get trapped or blocked so air cannot flow through your sinuses. This lets bacteria, viruses, and funguses grow, and that leads to infection. Follow these instructions at home: Medicines  Take, use, or apply over-the-counter and prescription medicines only as told by your doctor. These may include nasal sprays.  If you were prescribed an antibiotic medicine, take it as told by your doctor. Do not stop taking the antibiotic even if you start to feel better. Hydrate and Humidify  Drink enough water to keep your pee (urine) clear or pale yellow.  Use a cool mist humidifier to keep the humidity level in your home above 50%.  Breathe in steam for 10-15 minutes, 3-4 times a day or as told by your doctor. You can do this in the bathroom while a hot shower is running.  Try not to spend time in cool or dry air. Rest  Rest as much as possible.  Sleep with your head raised (elevated).  Make sure to get enough sleep each night. General instructions  Put a warm, moist washcloth on your face 3-4 times a day or as told by your doctor. This will help with discomfort.  Wash your hands often with soap and water. If there is no soap and water, use hand  sanitizer.  Do not smoke. Avoid being around people who are smoking (secondhand smoke).  Keep all follow-up visits as told by your doctor. This is important. Contact a doctor if:  You have a fever.  Your symptoms get worse.  Your symptoms do not get better within 10 days. Get help right away if:  You have a very bad headache.  You cannot stop throwing up (vomiting).  You have pain or swelling around your face or eyes.  You have trouble seeing.  You feel confused.  Your neck is stiff.  You have trouble breathing. This information is not intended to replace advice given to you by your health care provider. Make sure you discuss any questions you have with your health care provider. Document Released: 05/16/2008 Document Revised: 07/24/2016 Document Reviewed: 09/23/2015 Elsevier Interactive Patient Education  [employer]2018 [employer] Inc.

## 2017-12-18 NOTE — Progress Notes (Signed)
    HPI  Patient presents today here with flulike illness.  Patient's had 5 days of nasal congestion, cough, sore throat, body aches.  He is tolerating food and fluids like usual. He is also having persistent loose stools.  He is also having intermittent sinus pain and pressure, he denies any significant shortness of breath.  His wife is a respiratory therapist at the hospital and had a flu shot, he did not have a flu shot, she had a preceding illness that lasted about 2 days.   PMH: Smoking status noted ROS: Per HPI  Objective: BP (!) 139/100   Pulse (!) 117   Temp 98.2 F (36.8 C) (Oral)   Ht 5\' 7"  (1.702 m)   Wt 195 lb (88.5 kg)   SpO2 98%   BMI 30.54 kg/m  Gen: NAD, alert, cooperative with exam HEENT: NCAT, oropharynx moist and clear, TMs normal bilaterally, mild conjunctival injection on the right CV: RRR, good S1/S2, no murmur Resp: CTABL, no wheezes, non-labored Ext: No edema, warm Neuro: Alert and oriented, No gross deficits  Assessment and plan:  #Acute sinusitis Very likely that he has influenza as well, given Augmentin given negative testing and severely ill appearance. Patient without discrete tenderness of the sinuses on exam, however he is ill-appearing with tachycardia and noticeable fatigue Patient has normal respirations, he is tolerating food and fluids like usual, there is no reason for hospitalization on my exam. Given Hycodan cough syrup for nighttime cough Discussed supportive care.   Refilled fluoxetine which patient states is doing well.  He agrees to have a physical exam in the next few months with his PCP.   Orders Placed This Encounter  Procedures  . Veritor Flu A/B Waived    Order Specific Question:   Source    Answer:   nasal    Meds ordered this encounter  Medications  . amoxicillin-clavulanate (AUGMENTIN) 875-125 MG tablet    Sig: Take 1 tablet by mouth 2 (two) times daily.    Dispense:  20 tablet    Refill:  0  .  HYDROcodone-homatropine (HYCODAN) 5-1.5 MG/5ML syrup    Sig: Take 5 mLs by mouth every 6 (six) hours as needed for cough.    Dispense:  120 mL    Refill:  0    Murtis SinkSam Brinn Westby, MD [facility] 12/18/2017, 2:33 PM

## 2017-12-21 LAB — VERITOR FLU A/B WAIVED
INFLUENZA A: NEGATIVE
INFLUENZA B: NEGATIVE

## 2018-01-03 ENCOUNTER — Ambulatory Visit: Payer: 59 | Admitting: Family Medicine

## 2018-01-03 ENCOUNTER — Encounter: Payer: Self-pay | Admitting: Family Medicine

## 2018-01-03 VITALS — BP 122/86 | HR 91 | Temp 98.1°F | Ht 67.0 in | Wt 196.0 lb

## 2018-01-03 DIAGNOSIS — R202 Paresthesia of skin: Secondary | ICD-10-CM | POA: Diagnosis not present

## 2018-01-03 MED ORDER — BETAMETHASONE SOD PHOS & ACET 6 (3-3) MG/ML IJ SUSP
6.0000 mg | Freq: Once | INTRAMUSCULAR | Status: AC
  Administered 2018-01-03: 6 mg via INTRAMUSCULAR

## 2018-01-03 NOTE — Progress Notes (Signed)
Subjective:  Patient ID: Isaac [name], male    DOB: 03-Apr-1976  Age: 42 y.o. MRN: [id number]  CC: hand numbness (pt here today c/o bilateral hand numbness for the past week)   HPI Isaac [name] presents for seen recently for acute illness he felt very bad for several days he took some antibiotics after being seen by Dr. Ermalinda Memos.  That note was reviewed.  Starting 2 days after he was seen here he noticed that there was numbness and tingling in his fingertips bilaterally.  It is felt in the hypothenar area bilaterally as well.  He had a history of pinched nerve in the neck in the past that responded to steroids.  However on that occasion it was unilateral to the right and he could feel the symptoms some in the arm as well.  This time the numbness and tingling are limited to the fingertips and hypothenar region bilaterally and are symmetric.  His biggest concern of course is that his mother has MS and 1 of her symptoms is numbness and tingling.  However, he denies any symptoms referable to optic neuritis.  He has had no other migratory numbness symptoms.  He does have a bit of anxiety and depression that is treated chronically with the Paxil.  Of note is that he recently quit chewing tobacco.  He is wondering if that might of been related as well  Depression screen [facility] 2/9 01/03/2018 12/18/2017 02/01/2017  Decreased Interest 0 0 0  Down, Depressed, Hopeless 0 0 0  PHQ - 2 Score 0 0 0    History [place] has a past medical history of Anxiety and Kidney stones.   He has a past surgical history that includes Wisdom tooth extraction (1995) and Extracorporeal shock wave lithotripsy (Left, 04/13/2017).   His family history includes Anxiety disorder in his daughter; Asthma in his brother and daughter; Diabetes in his father, paternal grandfather, and paternal grandmother; Hearing loss in his maternal grandfather; Heart disease in his father, paternal grandfather, and paternal grandmother; Hyperlipidemia in his  father, paternal grandfather, and paternal grandmother; Hypertension in his father, paternal grandfather, and paternal grandmother.He reports that he quit smoking about 10 years ago. His smokeless tobacco use includes chew. He reports that he does not drink alcohol or use drugs.    ROS Review of Systems  Constitutional: Negative for chills, diaphoresis, fever and unexpected weight change.  HENT: Negative for congestion, hearing loss, rhinorrhea and sore throat.   Eyes: Negative for visual disturbance.  Respiratory: Negative for cough and shortness of breath.   Cardiovascular: Negative for chest pain.  Gastrointestinal: Negative for abdominal pain, constipation and diarrhea.  Genitourinary: Negative for dysuria and flank pain.  Musculoskeletal: Negative for arthralgias and joint swelling.  Skin: Negative for rash.  Neurological: Positive for numbness. Negative for dizziness, tremors, weakness and headaches.  Psychiatric/Behavioral: Negative for dysphoric mood and sleep disturbance. The patient is nervous/anxious.     Objective:  BP 122/86   Pulse 91   Temp 98.1 F (36.7 C) (Oral)   Ht 5\' 7"  (1.702 m)   Wt 196 lb (88.9 kg)   BMI 30.70 kg/m   BP Readings from Last 3 Encounters:  01/03/18 122/86  12/18/17 (!) 139/100  04/13/17 (!) 131/95    Wt Readings from Last 3 Encounters:  01/03/18 196 lb (88.9 kg)  12/18/17 195 lb (88.5 kg)  04/13/17 188 lb 12.8 oz (85.6 kg)     Physical Exam  Constitutional: He is oriented to person,  place, and time. He appears well-developed and well-nourished. No distress.  HENT:  Head: Normocephalic and atraumatic.  Right Ear: External ear normal.  Left Ear: External ear normal.  Nose: Nose normal.  Mouth/Throat: Oropharynx is clear and moist.  Eyes: Conjunctivae and EOM are normal. Pupils are equal, round, and reactive to light.  Neck: Normal range of motion. Neck supple. No thyromegaly present.  Cardiovascular: Normal rate, regular rhythm  and normal heart sounds.  No murmur heard. Pulmonary/Chest: Effort normal and breath sounds normal. No respiratory distress. He has no wheezes. He has no rales.  Abdominal: Soft. Bowel sounds are normal. He exhibits no distension. There is no tenderness.  Lymphadenopathy:    He has no cervical adenopathy.  Neurological: He is alert and oriented to person, place, and time. He has normal reflexes. He displays normal reflexes. No cranial nerve deficit. He exhibits normal muscle tone. Coordination normal.  Skin: Skin is warm and dry.  Psychiatric: He has a normal mood and affect. His behavior is normal. Judgment and thought content normal.      Assessment & Plan:   Isaac [name] was seen today for hand numbness.  Diagnoses and all orders for this visit:  Paresthesia -     betamethasone acetate-betamethasone sodium phosphate (CELESTONE) injection 6 mg   I suspect this is more of a post viral syndrome where some inflammation may have inflamed the ulnar canal and or the cervical region of the spine.   I have discontinued Isaac [name]'s oxyCODONE-acetaminophen, ondansetron, tamsulosin, amoxicillin-clavulanate, and HYDROcodone-homatropine. I am also having him maintain his PARoxetine. We administered betamethasone acetate-betamethasone sodium phosphate.  Allergies as of 01/03/2018   No Known Allergies     Medication List        Accurate as of 01/03/18  5:22 PM. Always use your most recent med list.          PARoxetine 40 MG tablet Commonly known as:  PAXIL Take 1 tablet (40 mg total) by mouth daily.        Follow-up: Return in about 6 months (around 07/03/2018).  Mechele ClaudeWarren Ashtyn Freilich, M.D.

## 2018-01-08 ENCOUNTER — Other Ambulatory Visit: Payer: Self-pay | Admitting: Family Medicine

## 2018-01-08 ENCOUNTER — Telehealth: Payer: Self-pay | Admitting: Family Medicine

## 2018-01-08 ENCOUNTER — Encounter: Payer: Self-pay | Admitting: Family Medicine

## 2018-01-08 DIAGNOSIS — R202 Paresthesia of skin: Principal | ICD-10-CM

## 2018-01-08 DIAGNOSIS — R200 Anesthesia of skin: Secondary | ICD-10-CM

## 2018-01-08 MED ORDER — PREDNISONE 10 MG PO TABS: ORAL_TABLET | ORAL | 0 refills | Status: DC

## 2018-01-08 NOTE — Telephone Encounter (Signed)
Pt aware.

## 2018-01-08 NOTE — Telephone Encounter (Signed)
Notified patient that tests were ordered. He wants to know if you can call in prednisone until he is able to get this appt. Please advise and route to pool B

## 2018-01-08 NOTE — Telephone Encounter (Signed)
Please contact the patient to let him know that I sent a 10-day prednisone taper to [facility]

## 2018-01-08 NOTE — Telephone Encounter (Signed)
Please let the patient know that I ordered test to figure out why the numbness is persistent.

## 2018-01-15 ENCOUNTER — Encounter: Payer: Self-pay | Admitting: Family Medicine

## 2018-01-15 ENCOUNTER — Ambulatory Visit (INDEPENDENT_AMBULATORY_CARE_PROVIDER_SITE_OTHER): Payer: 59 | Admitting: Family Medicine

## 2018-01-15 VITALS — BP 135/88 | HR 100 | Temp 98.6°F | Ht 67.0 in | Wt 192.2 lb

## 2018-01-15 DIAGNOSIS — Z0000 Encounter for general adult medical examination without abnormal findings: Secondary | ICD-10-CM

## 2018-01-15 DIAGNOSIS — G629 Polyneuropathy, unspecified: Secondary | ICD-10-CM

## 2018-01-15 DIAGNOSIS — N529 Male erectile dysfunction, unspecified: Secondary | ICD-10-CM

## 2018-01-15 NOTE — Progress Notes (Signed)
   HPI  Patient presents today here for annual physical exam.  Patient is well, he is physically active but also making changes to his diet cutting out sugar recently.  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, he does mostly computer work.  Patient complains of 4-6 weeks or so of bilateral hand numbness and tingling in all 10 fingers.  He states that he previously had some cervical neck pain that radiated down his right medial arm into his fourth and fifth digits, this is slightly different. He is tried betamethasone injection and a course of prednisone without much improvement.  He will return for fasting labs.  He is also had recent decrease in his sexual desire and difficulty maintaining erection.  PMH: Smoking status noted ROS: Per HPI  Objective: BP 135/88   Pulse 100   Temp 98.6 F (37 C) (Oral)   Ht '5\' 7"'$  (1.702 m)   Wt 192 lb 3.2 oz (87.2 kg)   BMI 30.10 kg/m  Gen: NAD, alert, cooperative with exam HEENT: NCAT, EOMI, PERRL CV: RRR, good S1/S2, no murmur Resp: CTABL, no wheezes, non-labored Abd: SNTND, BS present, no guarding or organomegaly Ext: No edema, warm Neuro: Alert and oriented, No gross deficits  Assessment and plan:  #Annual physical exam Slightly elevated weight, however otherwise normal exam. Labs, will return for fasting labs  #Neuropathy Unusual symptoms, improved slightly, however still persistent. Refer to neurology, appreciate their recommendations TSH, A1c, CBC  #ED. Patient also with also decreased sex drive Return for testosterone between 8 and 10 AM Offered Viagra, he will consider after labs   Orders Placed This Encounter  Procedures  . Testosterone    Standing Status:   Future    Standing Expiration Date:   01/15/2019  . CBC with Differential/Platelet    Standing Status:   Future    Standing Expiration Date:   01/15/2019  . CMP14+EGFR    Standing Status:   Future    Standing Expiration Date:   01/15/2019  . Lipid panel    Standing  Status:   Future    Standing Expiration Date:   01/15/2019  . TSH    Standing Status:   Future    Standing Expiration Date:   01/15/2019  . Bayer DCA Hb A1c Waived    Standing Status:   Future    Standing Expiration Date:   01/15/2019  . Ambulatory referral to Neurology    Referral Priority:   Routine    Referral Type:   Consultation    Referral Reason:   Specialty Services Required    Requested Specialty:   Neurology    Number of Visits Requested:   [place], MD Fans[place]we 01/15/2018, 2:54 PM   '

## 2018-01-15 NOTE — Patient Instructions (Signed)
Great to see you!   Health Maintenance, Male A healthy lifestyle and preventive care is important for your health and wellness. Ask your health care provider about what schedule of regular examinations is right for you. What should I know about weight and diet? Eat a Healthy Diet  Eat plenty of vegetables, fruits, whole grains, low-fat dairy products, and lean protein.  Do not eat a lot of foods high in solid fats, added sugars, or salt.  Maintain a Healthy Weight Regular exercise can help you achieve or maintain a healthy weight. You should:  Do at least 150 minutes of exercise each week. The exercise should increase your heart rate and make you sweat (moderate-intensity exercise).  Do strength-training exercises at least twice a week.  Watch Your Levels of Cholesterol and Blood Lipids  Have your blood tested for lipids and cholesterol every 5 years starting at 42 years of age. If you are at high risk for heart disease, you should start having your blood tested when you are 42 years old. You may need to have your cholesterol levels checked more often if: ? Your lipid or cholesterol levels are high. ? You are older than 42 years of age. ? You are at high risk for heart disease.  What should I know about cancer screening? Many types of cancers can be detected early and may often be prevented. Lung Cancer  You should be screened every year for lung cancer if: ? You are a current smoker who has smoked for at least 30 years. ? You are a former smoker who has quit within the past 15 years.  Talk to your health care provider about your screening options, when you should start screening, and how often you should be screened.  Colorectal Cancer  Routine colorectal cancer screening usually begins at 42 years of age and should be repeated every 5-10 years until you are 42 years old. You may need to be screened more often if early forms of precancerous polyps or small growths are found.  Your health care provider may recommend screening at an earlier age if you have risk factors for colon cancer.  Your health care provider may recommend using home test kits to check for hidden blood in the stool.  A small camera at the end of a tube can be used to examine your colon (sigmoidoscopy or colonoscopy). This checks for the earliest forms of colorectal cancer.  Prostate and Testicular Cancer  Depending on your age and overall health, your health care provider may do certain tests to screen for prostate and testicular cancer.  Talk to your health care provider about any symptoms or concerns you have about testicular or prostate cancer.  Skin Cancer  Check your skin from head to toe regularly.  Tell your health care provider about any new moles or changes in moles, especially if: ? There is a change in a mole's size, shape, or color. ? You have a mole that is larger than a pencil eraser.  Always use sunscreen. Apply sunscreen liberally and repeat throughout the day.  Protect yourself by wearing long sleeves, pants, a wide-brimmed hat, and sunglasses when outside.  What should I know about heart disease, diabetes, and high blood pressure?  If you are 18-39 years of age, have your blood pressure checked every 3-5 years. If you are 40 years of age or older, have your blood pressure checked every year. You should have your blood pressure measured twice-once when you are at a   hospital or clinic, and once when you are not at a hospital or clinic. Record the average of the two measurements. To check your blood pressure when you are not at a hospital or clinic, you can use: ? An automated blood pressure machine at a pharmacy. ? A home blood pressure monitor.  Talk to your health care provider about your target blood pressure.  If you are between 45-79 years old, ask your health care provider if you should take aspirin to prevent heart disease.  Have regular diabetes screenings by  checking your fasting blood sugar level. ? If you are at a normal weight and have a low risk for diabetes, have this test once every three years after the age of 45. ? If you are overweight and have a high risk for diabetes, consider being tested at a younger age or more often.  A one-time screening for abdominal aortic aneurysm (AAA) by ultrasound is recommended for men aged 65-75 years who are current or former smokers. What should I know about preventing infection? Hepatitis B If you have a higher risk for hepatitis B, you should be screened for this virus. Talk with your health care provider to find out if you are at risk for hepatitis B infection. Hepatitis C Blood testing is recommended for:  Everyone born from 1945 through 1965.  Anyone with known risk factors for hepatitis C.  Sexually Transmitted Diseases (STDs)  You should be screened each year for STDs including gonorrhea and chlamydia if: ? You are sexually active and are younger than 42 years of age. ? You are older than 42 years of age and your health care provider tells you that you are at risk for this type of infection. ? Your sexual activity has changed since you were last screened and you are at an increased risk for chlamydia or gonorrhea. Ask your health care provider if you are at risk.  Talk with your health care provider about whether you are at high risk of being infected with HIV. Your health care provider may recommend a prescription medicine to help prevent HIV infection.  What else can I do?  Schedule regular health, dental, and eye exams.  Stay current with your vaccines (immunizations).  Do not use any tobacco products, such as cigarettes, chewing tobacco, and e-cigarettes. If you need help quitting, ask your health care provider.  Limit alcohol intake to no more than 2 drinks per day. One drink equals 12 ounces of beer, 5 ounces of wine, or 1 ounces of hard liquor.  Do not use street drugs.  Do not  share needles.  Ask your health care provider for help if you need support or information about quitting drugs.  Tell your health care provider if you often feel depressed.  Tell your health care provider if you have ever been abused or do not feel safe at home. This information is not intended to replace advice given to you by your health care provider. Make sure you discuss any questions you have with your health care provider. Document Released: 05/26/2008 Document Revised: 07/27/2016 Document Reviewed: 09/01/2015 Elsevier Interactive Patient Education  2018 Elsevier Inc.  

## 2018-01-18 ENCOUNTER — Other Ambulatory Visit: Payer: Self-pay | Admitting: *Deleted

## 2018-01-18 ENCOUNTER — Encounter: Payer: Self-pay | Admitting: Neurology

## 2018-01-18 DIAGNOSIS — G629 Polyneuropathy, unspecified: Secondary | ICD-10-CM

## 2018-01-20 ENCOUNTER — Other Ambulatory Visit: Payer: 59

## 2018-01-20 DIAGNOSIS — N529 Male erectile dysfunction, unspecified: Secondary | ICD-10-CM

## 2018-01-20 DIAGNOSIS — Z0000 Encounter for general adult medical examination without abnormal findings: Secondary | ICD-10-CM

## 2018-01-20 DIAGNOSIS — G629 Polyneuropathy, unspecified: Secondary | ICD-10-CM

## 2018-01-20 DIAGNOSIS — R079 Chest pain, unspecified: Secondary | ICD-10-CM

## 2018-01-20 DIAGNOSIS — R202 Paresthesia of skin: Secondary | ICD-10-CM

## 2018-01-20 DIAGNOSIS — R200 Anesthesia of skin: Secondary | ICD-10-CM

## 2018-01-21 LAB — CBC WITH DIFFERENTIAL/PLATELET
BASOS ABS: 0.1 10*3/uL (ref 0.0–0.2)
Basos: 1 %
EOS (ABSOLUTE): 0.1 10*3/uL (ref 0.0–0.4)
Eos: 1 %
Hematocrit: 45.9 % (ref 37.5–51.0)
Hemoglobin: 15.5 g/dL (ref 13.0–17.7)
IMMATURE GRANS (ABS): 0 10*3/uL (ref 0.0–0.1)
IMMATURE GRANULOCYTES: 1 %
LYMPHS: 29 %
Lymphocytes Absolute: 1.8 10*3/uL (ref 0.7–3.1)
MCH: 30.7 pg (ref 26.6–33.0)
MCHC: 33.8 g/dL (ref 31.5–35.7)
MCV: 91 fL (ref 79–97)
MONOCYTES: 5 %
Monocytes Absolute: 0.3 10*3/uL (ref 0.1–0.9)
Neutrophils Absolute: 4 10*3/uL (ref 1.4–7.0)
Neutrophils: 63 %
Platelets: 246 10*3/uL (ref 150–379)
RBC: 5.05 x10E6/uL (ref 4.14–5.80)
RDW: 13.7 % (ref 12.3–15.4)
WBC: 6.3 10*3/uL (ref 3.4–10.8)

## 2018-01-21 LAB — LIPID PANEL
CHOL/HDL RATIO: 5.2 ratio — AB (ref 0.0–5.0)
Cholesterol, Total: 222 mg/dL — ABNORMAL HIGH (ref 100–199)
HDL: 43 mg/dL (ref >39)
LDL Calculated: 142 mg/dL — ABNORMAL HIGH (ref 0–99)
Triglycerides: 187 mg/dL — ABNORMAL HIGH (ref 0–149)
VLDL Cholesterol Cal: 37 mg/dL (ref 5–40)

## 2018-01-21 LAB — CMP14+EGFR
ALK PHOS: 71 IU/L (ref 39–117)
ALT: 21 IU/L (ref 0–44)
AST: 17 IU/L (ref 0–40)
Albumin/Globulin Ratio: 1.7 (ref 1.2–2.2)
Albumin: 4.2 g/dL (ref 3.5–5.5)
BUN/Creatinine Ratio: 11 (ref 9–20)
BUN: 12 mg/dL (ref 6–24)
Bilirubin Total: 0.4 mg/dL (ref 0.0–1.2)
CHLORIDE: 104 mmol/L (ref 96–106)
CO2: 24 mmol/L (ref 20–29)
CREATININE: 1.06 mg/dL (ref 0.76–1.27)
Calcium: 9.1 mg/dL (ref 8.7–10.2)
GFR calc non Af Amer: 87 mL/min/{1.73_m2} (ref >59)
GFR, EST AFRICAN AMERICAN: 100 mL/min/{1.73_m2} (ref >59)
GLUCOSE: 85 mg/dL (ref 65–99)
Globulin, Total: 2.5 g/dL (ref 1.5–4.5)
Potassium: 4.5 mmol/L (ref 3.5–5.2)
Sodium: 144 mmol/L (ref 134–144)
TOTAL PROTEIN: 6.7 g/dL (ref 6.0–8.5)

## 2018-01-21 LAB — TSH: TSH: 0.992 u[IU]/mL (ref 0.450–4.500)

## 2018-01-21 LAB — TESTOSTERONE: TESTOSTERONE: 318 ng/dL (ref 264–916)

## 2018-01-22 LAB — BAYER DCA HB A1C WAIVED: HB A1C: 5.5 % (ref <7.0)

## 2018-01-30 ENCOUNTER — Ambulatory Visit: Payer: Self-pay | Admitting: Neurology

## 2018-01-30 ENCOUNTER — Encounter: Payer: Self-pay | Admitting: Neurology

## 2018-01-30 VITALS — BP 120/90 | HR 105 | Ht 67.0 in | Wt 192.0 lb

## 2018-01-30 DIAGNOSIS — R202 Paresthesia of skin: Secondary | ICD-10-CM

## 2018-01-30 DIAGNOSIS — M4802 Spinal stenosis, cervical region: Secondary | ICD-10-CM

## 2018-01-30 NOTE — Patient Instructions (Signed)
If you develop any new symptoms, please come back and see me

## 2018-01-30 NOTE — Progress Notes (Signed)
[facility] Clinic Note - Initial Visit   Date: 01/30/18  Isaac [name]: [id number] DOB: 08/15/1976   Dear Dr. Darlyn ReadStacks:   Thank you for your kind referral of Isaac [name] for consultation of bilateral hand tingling . Although his history is well known to you, please allow us to reiterate it for the purpose of our medical record. The patient was accompanied to the clinic by self.    History of Present Illness: Isaac [name] is a 42 y.o. right-handed Caucasian male with anxiety presenting for evaluation of bilateral hand paresthesias.    Starting around the week of January 7th 2019, he woke up one morning with numbness involving all his fingers.  He had constant sensation that his hands were asleep for 3 weeks.  He has some weakness and recalls fine motor tasks, such as buttoning his shirts was more challenging.  In the past, he has had issues with cervical radiculopathy which has responded to prednisone, so this was offered to him. About two weeks after he completed prednisone, he has not had any further spells of tingling of the hands or weakness.  He did not have any neck pain.    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  After a long day, sometimes his hands fall asleep which resolves usually within a few minutes, after shaking his hands.    He has had MRI cervical spine in 2012 which showed mild disc bulge at C4-5, C5-6, and C6-7 without any nerve impingement.  No recent imaging.   Out-side paper records, electronic medical record, and images have been reviewed where available and summarized as:  MRI cervical spine wo 03/25/2011:   No advanced disease or cause of the patient's described symptoms is identified.  Minimal non compressive disc bulges at C4-5, C5-6 and C6-7.   Past Medical History:  Diagnosis Date  . Anxiety   . Kidney stones     Past Surgical History:  Procedure Laterality Date  .  EXTRACORPOREAL SHOCK WAVE LITHOTRIPSY Left 04/13/2017   Procedure: LEFT EXTRACORPOREAL SHOCK WAVE LITHOTRIPSY (ESWL);  Surgeon: Jethro BolusSigmund Tannenbaum, MD;  Location: [facility];  Service: Urology;  Laterality: Left;  . WISDOM TOOTH EXTRACTION  1995     Medications:  Outpatient Encounter Medications as of 01/30/2018  Medication Sig  . PARoxetine (PAXIL) 40 MG tablet Take 1 tablet (40 mg total) by mouth daily.  . [DISCONTINUED] predniSONE (DELTASONE) 10 MG tablet Take 5 daily for 2 days followed by 4,3,2 and 1 for 2 days each.   No facility-administered encounter medications on file as of 01/30/2018.      Allergies: No Known Allergies  Family History: Family History  Problem Relation Age of Onset  . Heart disease Father   . Hyperlipidemia Father   . Diabetes Father   . Hypertension Father   . Asthma Brother   . Asthma Daughter   . Anxiety disorder Daughter   . Hearing loss Maternal Grandfather   . Diabetes Paternal Grandmother   . Heart disease Paternal Grandmother   . Hyperlipidemia Paternal Grandmother   . Hypertension Paternal Grandmother   . Heart disease Paternal Grandfather   . Hyperlipidemia Paternal Grandfather   . Diabetes Paternal Grandfather   . Hypertension Paternal Grandfather     Social History: Social History   Tobacco Use  . Smoking status: Former Smoker    Last attempt to quit: 01/11/2007    Years since quitting:  11.0  . Smokeless tobacco: Former Neurosurgeon    Types: Chew  Substance Use Topics  . Alcohol use: No  . Drug use: No   Social History   Social History Narrative   Lives with wife and 3 children in a one story home.  Works as a Investment banker, corporate.  Education: some college.     Review of Systems:  CONSTITUTIONAL: No fevers, chills, night sweats, or weight loss.   EYES: No visual changes or eye pain ENT: No hearing changes.  No history of nose bleeds.   RESPIRATORY: No cough, wheezing and shortness of breath.   CARDIOVASCULAR: Negative for  chest pain, and palpitations.   GI: Negative for abdominal discomfort, blood in stools or black stools.  No recent change in bowel habits.   GU:  No history of incontinence.   MUSCLOSKELETAL: No history of joint pain or swelling.  No myalgias.   SKIN: Negative for lesions, rash, and itching.   HEMATOLOGY/ONCOLOGY: Negative for prolonged bleeding, bruising easily, and swollen nodes.  No history of cancer.   ENDOCRINE: Negative for cold or heat intolerance, polydipsia or goiter.   PSYCH:  No depression or anxiety symptoms.   NEURO: As Above.   Vital Signs:  BP 120/90   Pulse (!) 105   Ht 5\' 7"  (1.702 m)   Wt 192 lb (87.1 kg)   SpO2 98%   BMI 30.07 kg/m    General Medical Exam:   General:  Well appearing, comfortable.   Eyes/ENT: see cranial nerve examination.   Neck: No masses appreciated.  Full range of motion without tenderness.  No carotid bruits. Respiratory:  Clear to auscultation, good air entry bilaterally.   Cardiac:  Regular rate and rhythm, no murmur.   Extremities:  No deformities, edema, or skin discoloration.  Skin:  No rashes or lesions.  Neurological Exam: MENTAL STATUS including orientation to time, place, person, recent and remote memory, attention span and concentration, language, and fund of knowledge is normal.  Speech is not dysarthric.  CRANIAL NERVES: II:  No visual field defects.  Unremarkable fundi.   III-IV-VI: Pupils equal round and reactive to light.  Normal conjugate, extra-ocular eye movements in all directions of gaze.  No nystagmus.  No ptosis.   V:  Normal facial sensation.  Jaw jerk is absent.   VII:  Normal facial symmetry and movements.  No pathologic facial reflexes.  VIII:  Normal hearing and vestibular function.   IX-X:  Normal palatal movement.   XI:  Normal shoulder shrug and head rotation.   XII:  Normal tongue strength and range of motion, no deviation or fasciculation.  MOTOR:  No atrophy, fasciculations or abnormal movements.  No  pronator drift.  Tone is normal.    Right Upper Extremity:    Left Upper Extremity:    Deltoid  5/5   Deltoid  5/5   Biceps  5/5   Biceps  5/5   Triceps  5/5   Triceps  5/5   Wrist extensors  5/5   Wrist extensors  5/5   Wrist flexors  5/5   Wrist flexors  5/5   Finger extensors  5/5   Finger extensors  5/5   Finger flexors  5/5   Finger flexors  5/5   Dorsal interossei  5/5   Dorsal interossei  5/5   Abductor pollicis  5/5   Abductor pollicis  5/5   Tone (Ashworth scale)  0  Tone (Ashworth scale)  0  Right Lower Extremity:    Left Lower Extremity:    Hip flexors  5/5   Hip flexors  5/5   Hip extensors  5/5   Hip extensors  5/5   Knee flexors  5/5   Knee flexors  5/5   Knee extensors  5/5   Knee extensors  5/5   Dorsiflexors  5/5   Dorsiflexors  5/5   Plantarflexors  5/5   Plantarflexors  5/5   Toe extensors  5/5   Toe extensors  5/5   Toe flexors  5/5   Toe flexors  5/5   Tone (Ashworth scale)  0  Tone (Ashworth scale)  0   MSRs:  Right                                                                 Left brachioradialis 2+  brachioradialis 3+  biceps 2+  biceps 3+  triceps 2+  triceps 3+  patellar 3+  patellar 3+  ankle jerk 2+  ankle jerk 2+  Hoffman no  Hoffman no  plantar response down  plantar response down   SENSORY:  Normal and symmetric perception of light touch, pinprick, vibration, and proprioception.  Romberg's sign absent.   COORDINATION/GAIT: Normal finger-to- nose-finger and heel-to-shin.  Intact rapid alternating movements bilaterally.  Able to rise from a chair without using arms.  Gait narrow based and stable. Tandem and stressed gait intact.    IMPRESSION: Bilateral hand paresthesias, now resolved.  His exam shows brisk reflexes, especially on the left side with normal tone, strength, and sensation.  He may have underlying cervical canal stenosis from worsening disc protrusion causing his hand paresthesias.  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  We discussed options of doing electrodiagnostic testing and MRI cervical spine to better localize his symptoms, but since he is no longer symptomatic, we decided to hold off on any additional testing.  If he develops any new neck pain or worsening paresthesias, I would recommend imaging of the cervical spine.  In the meantime, I offered suggestions to minimize compressoin of the ulnar nerve as a precaution.  All questions were answered.   The duration of this appointment visit was 40 minutes of face-to-face time with the patient.  Greater than 50% of this time was spent in counseling, explanation of diagnosis, planning of further management, and coordination of care.   Thank you for allowing me to participate in patient's care.  If I can answer any additional questions, I would be pleased to do so.    Sincerely,    Brandin Stetzer K. Allena Katz, DO

## 2018-02-08 ENCOUNTER — Encounter: Payer: Commercial Managed Care - HMO | Admitting: Neurology

## 2018-05-04 ENCOUNTER — Other Ambulatory Visit: Payer: Self-pay | Admitting: Family Medicine

## 2018-05-04 NOTE — Telephone Encounter (Signed)
Patient aware.

## 2018-05-04 NOTE — Telephone Encounter (Signed)
Please have patient schedule f/u visit for this medication w/ Stacks.  It seems as though he is being seen but this issue hasn't been addressed in a while.  Refill x3 months.  Will need OV for further fills.

## 2018-05-04 NOTE — Telephone Encounter (Signed)
Last seen 01/15/18  Dr Darlyn Read PCP

## 2018-06-26 DIAGNOSIS — J069 Acute upper respiratory infection, unspecified: Secondary | ICD-10-CM | POA: Diagnosis not present

## 2018-07-03 ENCOUNTER — Ambulatory Visit: Payer: 59 | Admitting: Family Medicine

## 2018-07-24 ENCOUNTER — Encounter: Payer: Self-pay | Admitting: Family Medicine

## 2018-07-24 ENCOUNTER — Ambulatory Visit: Payer: 59 | Admitting: Family Medicine

## 2018-07-24 VITALS — BP 125/88 | HR 79 | Temp 97.1°F | Ht 67.0 in | Wt 181.5 lb

## 2018-07-24 DIAGNOSIS — F418 Other specified anxiety disorders: Secondary | ICD-10-CM | POA: Diagnosis not present

## 2018-07-24 DIAGNOSIS — E782 Mixed hyperlipidemia: Secondary | ICD-10-CM

## 2018-07-24 MED ORDER — PAROXETINE HCL 40 MG PO TABS: 40.0000 mg | ORAL_TABLET | Freq: Every day | ORAL | 1 refills | Status: DC

## 2018-07-24 NOTE — Progress Notes (Signed)
Subjective:  Patient ID: Isaac [name], male    DOB: 1975/12/26  Age: 42 y.o. MRN: [id number]  CC: Medical Management of Chronic Issues   HPI Isaac [name] presents for follow up of chronic depression. He is happy with treatment and denies side effects of SSRI. Has negative PHQ 9.  HE has had elevated cholesterol in the past and is due for recheck. No heart history. Denies chest pain.H is working on a low fat diet and has lost 10 lb in 6 mos.  Depression screen [facility] 2/9 07/24/2018 01/15/2018 01/03/2018  Decreased Interest 0 0 0  Down, Depressed, Hopeless 0 0 0  PHQ - 2 Score 0 0 0    History Isaac [name] has a past medical history of Anxiety and Kidney stones.   He has a past surgical history that includes Wisdom tooth extraction (1995) and Extracorporeal shock wave lithotripsy (Left, 04/13/2017).   His family history includes Anxiety disorder in his daughter; Asthma in his brother and daughter; Diabetes in his father, paternal grandfather, and paternal grandmother; Hearing loss in his maternal grandfather; Heart disease in his father, paternal grandfather, and paternal grandmother; Hyperlipidemia in his father, paternal grandfather, and paternal grandmother; Hypertension in his father, paternal grandfather, and paternal grandmother.He reports that he quit smoking about 11 years ago. He has quit using smokeless tobacco.  His smokeless tobacco use included chew. He reports that he does not drink alcohol or use drugs.    ROS Review of Systems  Constitutional: Negative.   HENT: Negative.   Eyes: Negative for visual disturbance.  Respiratory: Negative for cough and shortness of breath.   Cardiovascular: Negative for chest pain and leg swelling.  Gastrointestinal: Negative for abdominal pain, diarrhea, nausea and vomiting.  Genitourinary: Negative for difficulty urinating.  Musculoskeletal: Negative for arthralgias and myalgias.  Skin: Negative for rash.  Neurological: Negative for headaches.    Psychiatric/Behavioral: Negative for sleep disturbance.    Objective:  BP 125/88   Pulse 79   Temp (!) 97.1 F (36.2 C) (Oral)   Ht 5' 7" (1.702 m)   Wt 181 lb 8 oz (82.3 kg)   BMI 28.43 kg/m   BP Readings from Last 3 Encounters:  07/24/18 125/88  01/30/18 120/90  01/15/18 135/88    Wt Readings from Last 3 Encounters:  07/24/18 181 lb 8 oz (82.3 kg)  01/30/18 192 lb (87.1 kg)  01/15/18 192 lb 3.2 oz (87.2 kg)     Physical Exam  Constitutional: He is oriented to person, place, and time. He appears well-developed and well-nourished. No distress.  HENT:  Head: Normocephalic and atraumatic.  Right Ear: External ear normal.  Left Ear: External ear normal.  Nose: Nose normal.  Mouth/Throat: Oropharynx is clear and moist.  Eyes: Pupils are equal, round, and reactive to light. Conjunctivae and EOM are normal.  Neck: Normal range of motion. Neck supple.  Cardiovascular: Normal rate, regular rhythm and normal heart sounds.  No murmur heard. Pulmonary/Chest: Effort normal and breath sounds normal. No respiratory distress. He has no wheezes. He has no rales.  Abdominal: Soft. There is no tenderness.  Musculoskeletal: Normal range of motion.  Neurological: He is [name] and oriented to person, place, and time. He has normal reflexes.  Skin: Skin is warm and dry.  Psychiatric: He has a normal mood and affect. His behavior is normal. Judgment and thought content normal.      Assessment & Plan:   Isaac [name] was seen today for medical management of chronic issues.  Diagnoses and all orders for this visit:  Mixed hyperlipidemia -     CBC with Differential/Platelet; Future -     CMP14+EGFR; Future -     Lipid panel; Future  Depression with anxiety  Other orders -     PARoxetine (PAXIL) 40 MG tablet; Take 1 tablet (40 mg total) by mouth daily.      Allergies as of 07/24/2018   No Known Allergies     Medication List        Accurate as of 07/24/18  6:17 PM. Always use  your most recent med list.          PARoxetine 40 MG tablet Commonly known as:  PAXIL Take 1 tablet (40 mg total) by mouth daily.      Low fat diet. Consider statin.  Follow-up: Return in about 6 months (around 01/24/2019) for Wellness.  Claretta Fraise, M.D.

## 2018-07-28 ENCOUNTER — Other Ambulatory Visit: Payer: 59

## 2018-07-28 DIAGNOSIS — E782 Mixed hyperlipidemia: Secondary | ICD-10-CM | POA: Diagnosis not present

## 2018-07-29 LAB — CBC WITH DIFFERENTIAL/PLATELET
BASOS: 1 %
Basophils Absolute: 0 10*3/uL (ref 0.0–0.2)
EOS (ABSOLUTE): 0.1 10*3/uL (ref 0.0–0.4)
Eos: 2 %
Hematocrit: 44.7 % (ref 37.5–51.0)
Hemoglobin: 14.7 g/dL (ref 13.0–17.7)
Immature Grans (Abs): 0 10*3/uL (ref 0.0–0.1)
Immature Granulocytes: 0 %
Lymphocytes Absolute: 1.4 10*3/uL (ref 0.7–3.1)
Lymphs: 28 %
MCH: 29.9 pg (ref 26.6–33.0)
MCHC: 32.9 g/dL (ref 31.5–35.7)
MCV: 91 fL (ref 79–97)
MONOS ABS: 0.4 10*3/uL (ref 0.1–0.9)
Monocytes: 9 %
NEUTROS ABS: 3.1 10*3/uL (ref 1.4–7.0)
Neutrophils: 60 %
PLATELETS: 268 10*3/uL (ref 150–450)
RBC: 4.92 x10E6/uL (ref 4.14–5.80)
RDW: 13.5 % (ref 12.3–15.4)
WBC: 5.1 10*3/uL (ref 3.4–10.8)

## 2018-07-29 LAB — CMP14+EGFR
ALT: 22 IU/L (ref 0–44)
AST: 19 IU/L (ref 0–40)
Albumin/Globulin Ratio: 1.8 (ref 1.2–2.2)
Albumin: 4.4 g/dL (ref 3.5–5.5)
Alkaline Phosphatase: 68 IU/L (ref 39–117)
BUN/Creatinine Ratio: 14 (ref 9–20)
BUN: 15 mg/dL (ref 6–24)
Bilirubin Total: 0.7 mg/dL (ref 0.0–1.2)
CALCIUM: 9.4 mg/dL (ref 8.7–10.2)
CO2: 26 mmol/L (ref 20–29)
Chloride: 104 mmol/L (ref 96–106)
Creatinine, Ser: 1.11 mg/dL (ref 0.76–1.27)
GFR, EST AFRICAN AMERICAN: 94 mL/min/{1.73_m2} (ref >59)
GFR, EST NON AFRICAN AMERICAN: 81 mL/min/{1.73_m2} (ref >59)
GLOBULIN, TOTAL: 2.5 g/dL (ref 1.5–4.5)
Glucose: 96 mg/dL (ref 65–99)
Potassium: 4.3 mmol/L (ref 3.5–5.2)
SODIUM: 141 mmol/L (ref 134–144)
TOTAL PROTEIN: 6.9 g/dL (ref 6.0–8.5)

## 2018-07-29 LAB — LIPID PANEL
CHOLESTEROL TOTAL: 214 mg/dL — AB (ref 100–199)
Chol/HDL Ratio: 5.6 ratio — ABNORMAL HIGH (ref 0.0–5.0)
HDL: 38 mg/dL — ABNORMAL LOW (ref >39)
LDL CALC: 146 mg/dL — AB (ref 0–99)
TRIGLYCERIDES: 150 mg/dL — AB (ref 0–149)
VLDL Cholesterol Cal: 30 mg/dL (ref 5–40)

## 2018-09-24 ENCOUNTER — Other Ambulatory Visit: Payer: Self-pay | Admitting: Family Medicine

## 2018-09-24 ENCOUNTER — Other Ambulatory Visit: Payer: Self-pay | Admitting: *Deleted

## 2018-09-24 ENCOUNTER — Ambulatory Visit: Payer: 59 | Admitting: Family Medicine

## 2018-09-24 ENCOUNTER — Encounter: Payer: Self-pay | Admitting: Family Medicine

## 2018-09-24 VITALS — BP 124/89 | HR 80 | Temp 98.4°F | Ht 67.0 in | Wt 179.0 lb

## 2018-09-24 DIAGNOSIS — K648 Other hemorrhoids: Secondary | ICD-10-CM

## 2018-09-24 DIAGNOSIS — Z23 Encounter for immunization: Secondary | ICD-10-CM | POA: Diagnosis not present

## 2018-09-24 IMAGING — CR DG ABDOMEN 1V
2 series · 2 of 2 positions shown · non-contrast
Comparison: None.

CLINICAL DATA: Acute onset left flank pain x 2 hrs, around into
abd. Hx renal stones.

EXAM:
ABDOMEN - 1 VIEW

[t abdomen supine (1 of 2)]
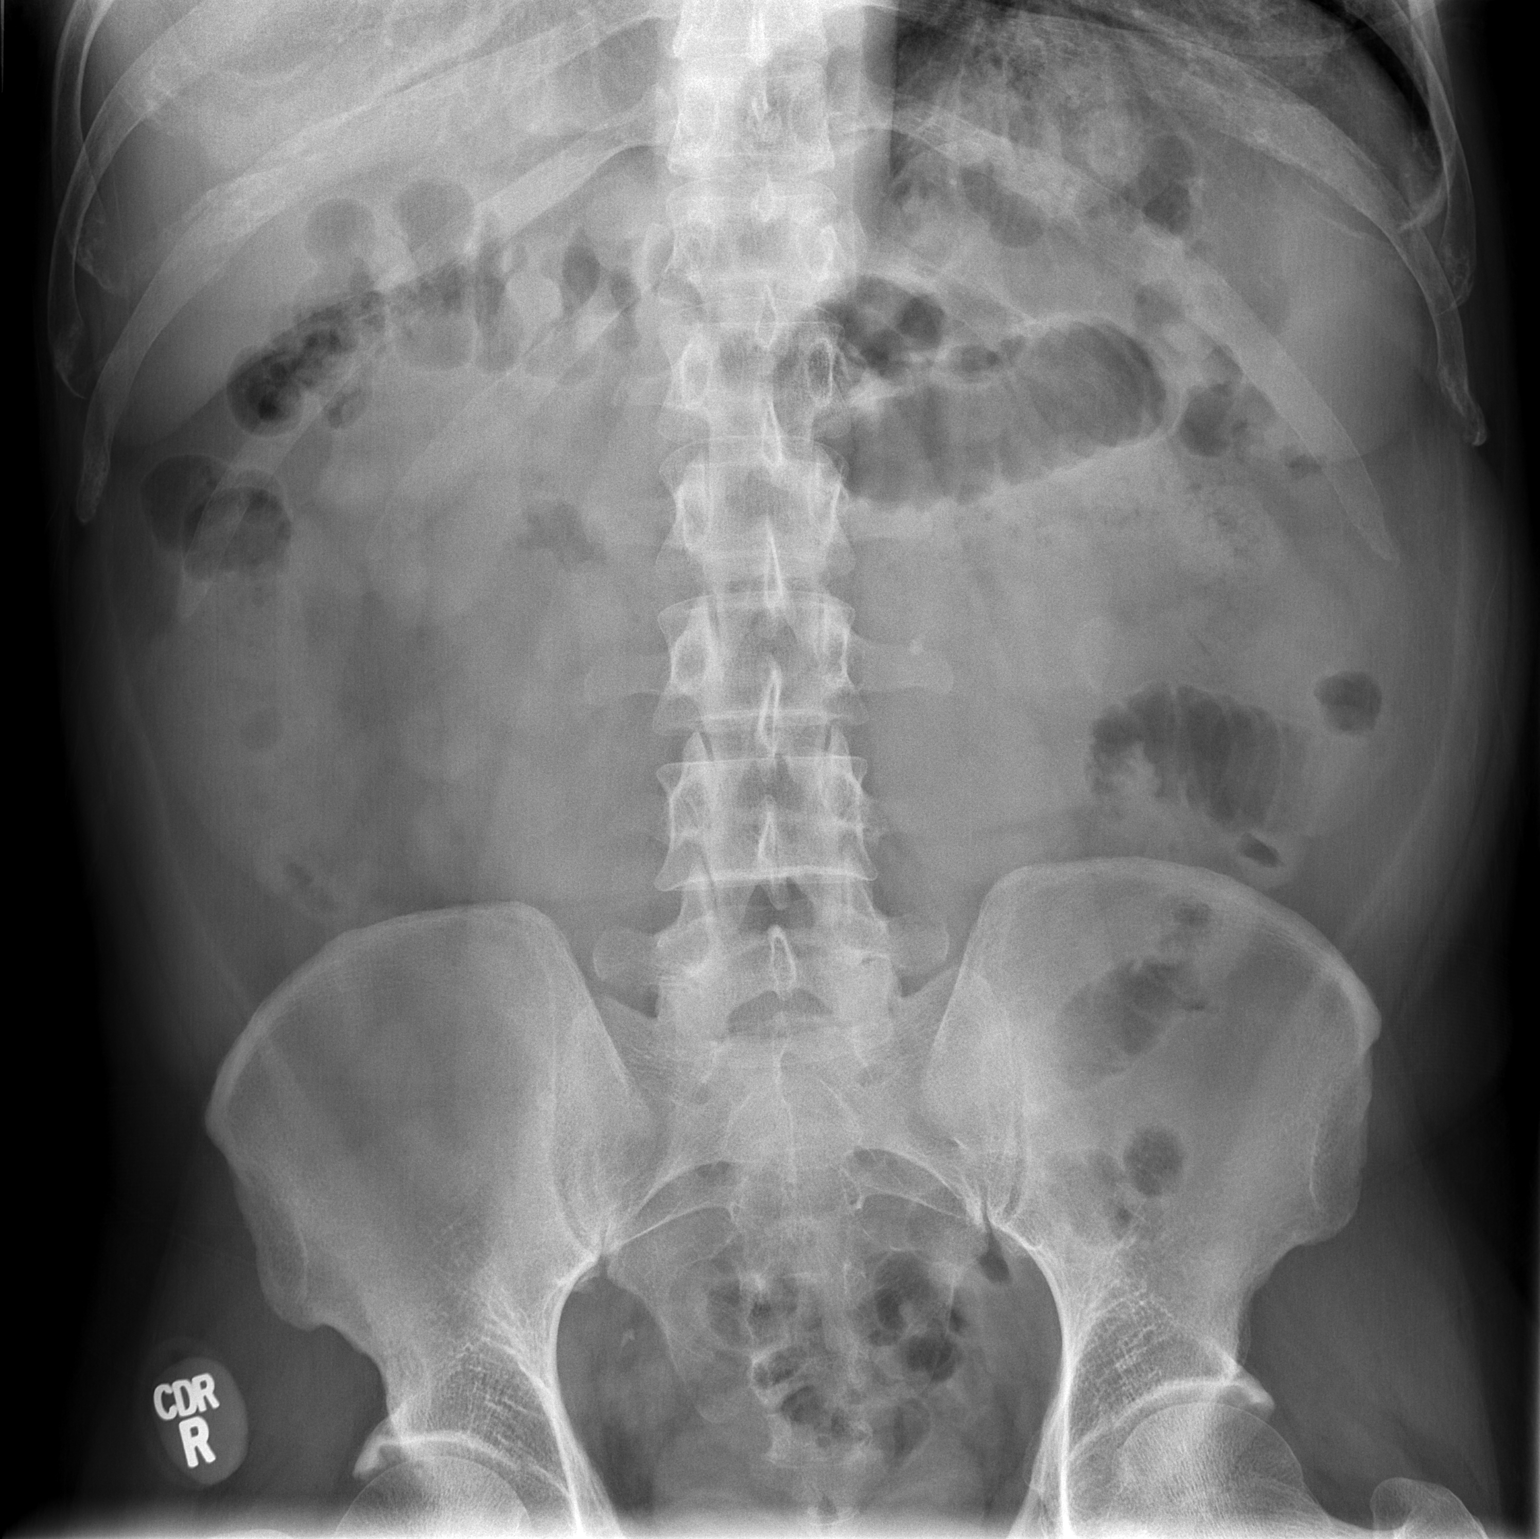

[t abdomen supine (2 of 2)]
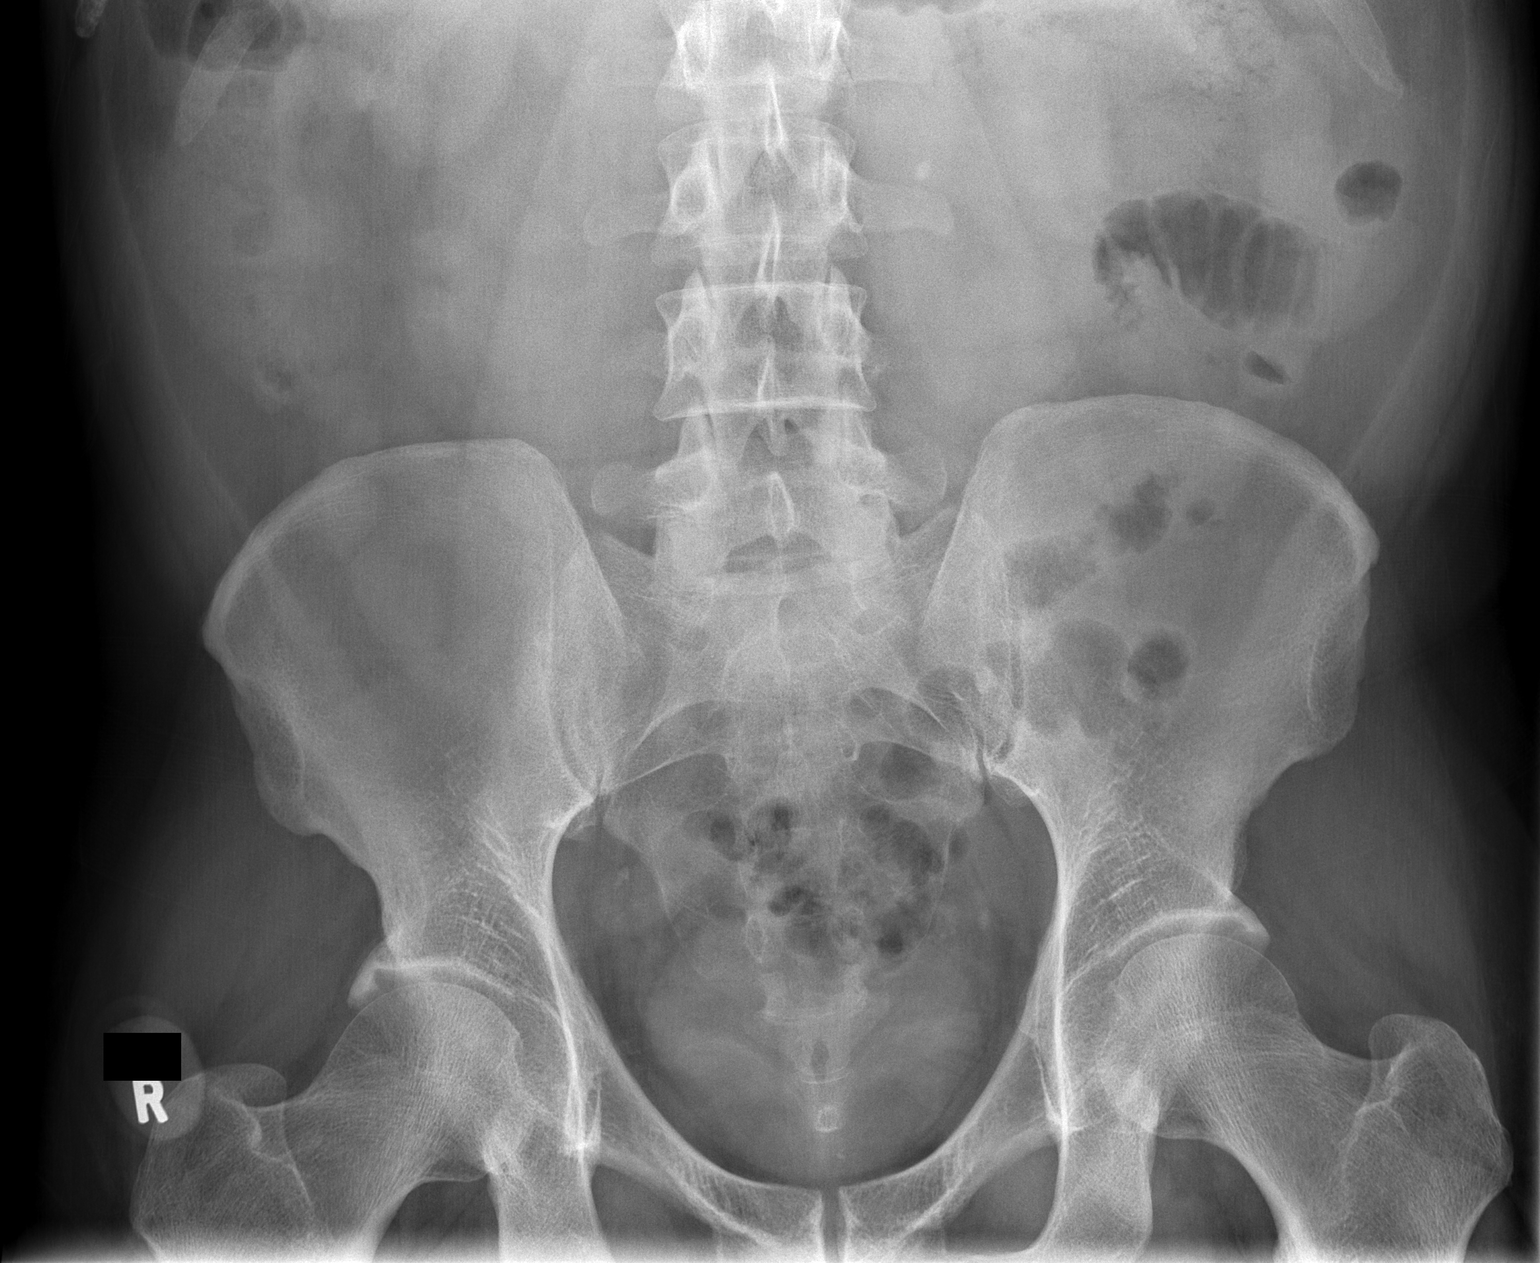

[2 of 2 positions shown; findings below may reference images not displayed]

FINDINGS: Mildly prominent small bowel loops are seen within the left abdomen,
upper normal in caliber at approximately 3 cm diameter. Overall
bowel gas pattern is otherwise nonobstructive. No evidence of soft
tissue mass or abnormal fluid collection. No evidence of free
intraperitoneal air.

5 mm calcification is positioned just to the left of the L3
vertebral body, highly suspicious for ureteral stone.
IMPRESSION: 5 mm calcification just to the left of the L3 vertebral body, highly
suspicious for ureteral stone. Consider noncontrast abdomen and
pelvis CT for confirmation and to exclude a significant
hydronephrosis.

## 2018-09-24 MED ORDER — DOCUSATE SODIUM 100 MG PO CAPS: 100.0000 mg | ORAL_CAPSULE | Freq: Two times a day (BID) | ORAL | 0 refills | Status: DC

## 2018-09-24 MED ORDER — STARCH 51 % RE SUPP: 1.0000 | RECTAL | 0 refills | Status: DC | PRN

## 2018-09-24 MED ORDER — HYDROCORTISONE ACETATE 25 MG RE SUPP: 25.0000 mg | Freq: Two times a day (BID) | RECTAL | 0 refills | Status: AC

## 2018-09-24 NOTE — Patient Instructions (Addendum)
How to Take a Sitz Bath A sitz bath is a warm water bath that is taken while you are sitting down. The water should only come up to your hips and should cover your buttocks. Your health care provider may recommend a sitz bath to help you:  Clean the lower part of your body, including your genital area.  With itching.  With pain.  With sore muscles or muscles that tighten or spasm.  How to take a sitz bath Take 3-4 sitz baths per day or as told by your health care provider. 1. Partially fill a bathtub with warm water. You will only need the water to be deep enough to cover your hips and buttocks when you are sitting in it. 2. If your health care provider told you to put medicine in the water, follow the directions exactly. 3. Sit in the water and open the tub drain a little. 4. Turn on the warm water again to keep the tub at the correct level. Keep the water running constantly. 5. Soak in the water for 15-20 minutes or as told by your health care provider. 6. After the sitz bath, pat the affected area dry first. Do not rub it. 7. Be careful when you stand up after the sitz bath because you may feel dizzy.  Contact a health care provider if:  Your symptoms get worse. Do not continue with sitz baths if your symptoms get worse.  You have new symptoms. Do not continue with sitz baths until you talk with your health care provider. This information is not intended to replace advice given to you by your health care provider. Make sure you discuss any questions you have with your health care provider. Document Released: 08/20/2004 Document Revised: 04/27/2016 Document Reviewed: 11/26/2014 Elsevier Interactive Patient Education  2018 Elsevier Inc. Hemorrhoids Hemorrhoids are swollen veins in and around the rectum or anus. Hemorrhoids can cause pain, itching, or bleeding. Most of the time, they do not cause serious problems. They usually get better with diet changes, lifestyle changes, and other  home treatments. Follow these instructions at home: Eating and drinking  Eat foods that have fiber, such as whole grains, beans, nuts, fruits, and vegetables. Ask your doctor about taking products that have added fiber (fibersupplements).  Drink enough fluid to keep your pee (urine) clear or pale yellow. For Pain and Swelling  Take a warm-water bath (sitz bath) for 20 minutes to ease pain. Do this 3-4 times a day.  If directed, put ice on the painful area. It may be helpful to use ice between your warm baths. ? Put ice in a plastic bag. ? Place a towel between your skin and the bag. ? Leave the ice on for 20 minutes, 2-3 times a day. General instructions  Take over-the-counter and prescription medicines only as told by your doctor. ? Medicated creams and medicines that are inserted into the anus (suppositories) may be used or applied as told.  Exercise often.  Go to the bathroom when you have the urge to poop (to have a bowel movement). Do not wait.  Avoid pushing too hard (straining) when you poop.  Keep the butt area dry and clean. Use wet toilet paper or moist paper towels.  Do not sit on the toilet for a long time. Contact a doctor if:  You have any of these: ? Pain and swelling that do not get better with treatment or medicine. ? Bleeding that will not stop. ? Trouble pooping or you   cannot poop. ? Pain or swelling outside the area of the hemorrhoids. This information is not intended to replace advice given to you by your health care provider. Make sure you discuss any questions you have with your health care provider. Document Released: 09/06/2008 Document Revised: 05/05/2016 Document Reviewed: 08/12/2015 Elsevier Interactive Patient Education  2018 Elsevier Inc.  

## 2018-09-24 NOTE — Progress Notes (Addendum)
Subjective:    Patient ID: Isaac [name], male    DOB: 10-17-1976, 42 y.o.   MRN: [id number]  Chief Complaint:  Hemorrhoids   HPI: Isaac [name] is a 42 y.o. male presenting on 09/24/2018 for Hemorrhoids  Pt presents today for a possible hemorrhoids. States he has had symptoms for 2 weeks. States he has itching, burning, and sharp pain with bowel movements. He states his stools are hard at times. States he does have bright red blood on the toilet paper after having a bowel movement. Denies excessive blood in his stool. Pt states he has tried over the counter wipes and cream without relief of symptoms.  Relevant past medical, surgical, family, and social history reviewed and updated as indicated.  Allergies and medications reviewed and updated.   Past Medical History:  Diagnosis Date  . Anxiety   . Kidney stones     Past Surgical History:  Procedure Laterality Date  . EXTRACORPOREAL SHOCK WAVE LITHOTRIPSY Left 04/13/2017   Procedure: LEFT EXTRACORPOREAL SHOCK WAVE LITHOTRIPSY (ESWL);  Surgeon: Jethro Bolus, MD;  Location: [facility];  Service: Urology;  Laterality: Left;  . WISDOM TOOTH EXTRACTION  1995    Social History   Socioeconomic History  . Marital status: Married    Spouse name: Not on file  . Number of children: 3  . Years of education: 67  . Highest education level: Not on file  Occupational History  . Occupation: computer crimes Regulatory affairs officer: [employer]  Social Needs  . Financial resource strain: Not on file  . Food insecurity:    Worry: Not on file    Inability: Not on file  . Transportation needs:    Medical: Not on file    Non-medical: Not on file  Tobacco Use  . Smoking status: Former Smoker    Last attempt to quit: 01/11/2007    Years since quitting: 11.7  . Smokeless tobacco: Former Neurosurgeon    Types: Chew  Substance and Sexual Activity  . Alcohol use: No  . Drug use: No  . Sexual activity: Yes    Partners: Female    Lifestyle  . Physical activity:    Days per week: Not on file    Minutes per session: Not on file  . Stress: Not on file  Relationships  . Social connections:    Talks on phone: Not on file    Gets together: Not on file    Attends religious service: Not on file    Active member of club or organization: Not on file    Attends meetings of clubs or organizations: Not on file    Relationship status: Not on file  . Intimate partner violence:    Fear of current or ex partner: Not on file    Emotionally abused: Not on file    Physically abused: Not on file    Forced sexual activity: Not on file  Other Topics Concern  . Not on file  Social History Narrative   Lives with wife and 3 children in a one story home.  Works as a Investment banker, corporate.  Education: some college.     Outpatient Encounter Medications as of 09/24/2018  Medication Sig  . PARoxetine (PAXIL) 40 MG tablet Take 1 tablet (40 mg total) by mouth daily.  . hydrocortisone (ANUSOL-HC) 25 MG suppository Place 1 suppository (25 mg total) rectally 2 (two) times daily for 7 days.  . [DISCONTINUED] docusate sodium (COLACE)  100 MG capsule Take 1 capsule (100 mg total) by mouth 2 (two) times daily.  . [DISCONTINUED] starch (ANUSOL) 51 % suppository Place 1 suppository rectally as needed for pain.   No facility-administered encounter medications on file as of 09/24/2018.     No Known Allergies  Review of Systems  Constitutional: Negative for chills, fatigue and fever.  Respiratory: Negative for shortness of breath.   Cardiovascular: Negative for chest pain.  Gastrointestinal: Positive for anal bleeding (only with BM, bright red on tissue paper) and rectal pain (anal pruritis ). Negative for abdominal distention, abdominal pain, blood in stool, constipation, diarrhea, nausea and vomiting.  Skin: Negative for color change.  All other systems reviewed and are negative.       Objective:    BP 124/89   Pulse 80    Temp 98.4 F (36.9 C)   Ht 5\' 7"  (1.702 m)   Wt 179 lb (81.2 kg)   BMI 28.04 kg/m    Wt Readings from Last 3 Encounters:  09/24/18 179 lb (81.2 kg)  07/24/18 181 lb 8 oz (82.3 kg)  01/30/18 192 lb (87.1 kg)    Physical Exam  Constitutional: He is oriented to person, place, and time. He appears well-developed and well-nourished.  Cardiovascular: Normal rate, regular rhythm and normal heart sounds. Exam reveals no gallop and no friction rub.  No murmur heard. Pulmonary/Chest: Effort normal and breath sounds normal. No respiratory distress.  Abdominal: Soft. Bowel sounds are normal.  Genitourinary: Rectal exam shows internal hemorrhoid (Grade 2 at 5 o'clock ) and tenderness. Rectal exam shows no external hemorrhoid, no fissure and anal tone normal.  Neurological: He is alert and oriented to person, place, and time.  Skin: Skin is warm and dry. Capillary refill takes less than 2 seconds.  Psychiatric: He has a normal mood and affect. His behavior is normal. Judgment and thought content normal.  Nursing note and vitals reviewed.        Assessment & Plan:  Isaac [name] was seen today for hemorrhoids.  Diagnoses and all orders for this visit:  Hemorrhoids, internal Increase fiber and water intake. Avoid straining with bowel movements and lingering on the toilet. Medications as prescribed. Anusol for only 7 days. Sitz baths. May require referral to GI if symptoms persist despite conservative therapy. -     Anusol-HC 25mg   suppository; Place 1 suppository rectally as needed for pain. -     docusate sodium (COLACE) 100 MG capsule; Take 1 capsule (100 mg total) by mouth 2 (two) times daily.      Continue all other maintenance medications.  Follow up plan: Return if symptoms worsen or fail to improve.  Educational handout given for hemorrhoids, sitz bath  The above assessment and management plan was discussed with the patient. The patient verbalized understanding of and has agreed to  the management plan. Patient is aware to call the clinic if symptoms persist or worsen. Patient is aware when to return to the clinic for a follow-up visit. Patient educated on when it is appropriate to go to the emergency department.   Kari Baars, FNP-C [facility] [phone]

## 2018-09-24 NOTE — Addendum Note (Signed)
Addended by: Sonny Masters on: 09/24/2018 03:14 PM   Modules accepted: Orders

## 2018-09-25 NOTE — Telephone Encounter (Signed)
Please see request change and advise

## 2018-10-15 DIAGNOSIS — K921 Melena: Secondary | ICD-10-CM | POA: Diagnosis not present

## 2018-10-15 DIAGNOSIS — K6289 Other specified diseases of anus and rectum: Secondary | ICD-10-CM | POA: Diagnosis not present

## 2018-10-15 DIAGNOSIS — K602 Anal fissure, unspecified: Secondary | ICD-10-CM | POA: Diagnosis not present

## 2018-11-12 DIAGNOSIS — K602 Anal fissure, unspecified: Secondary | ICD-10-CM | POA: Diagnosis not present

## 2018-11-12 DIAGNOSIS — K6289 Other specified diseases of anus and rectum: Secondary | ICD-10-CM | POA: Diagnosis not present

## 2019-01-23 ENCOUNTER — Ambulatory Visit (INDEPENDENT_AMBULATORY_CARE_PROVIDER_SITE_OTHER): Payer: 59 | Admitting: Family Medicine

## 2019-01-23 ENCOUNTER — Encounter: Payer: Self-pay | Admitting: Family Medicine

## 2019-01-23 VITALS — BP 112/74 | HR 78 | Temp 95.5°F | Ht 67.0 in | Wt 179.4 lb

## 2019-01-23 DIAGNOSIS — Z0000 Encounter for general adult medical examination without abnormal findings: Secondary | ICD-10-CM | POA: Diagnosis not present

## 2019-01-23 LAB — URINALYSIS
Bilirubin, UA: NEGATIVE
Glucose, UA: NEGATIVE
Ketones, UA: NEGATIVE
Leukocytes, UA: NEGATIVE
Nitrite, UA: NEGATIVE
Protein, UA: NEGATIVE
RBC UA: NEGATIVE
Specific Gravity, UA: 1.025 (ref 1.005–1.030)
Urobilinogen, Ur: 1 mg/dL (ref 0.2–1.0)
pH, UA: 6 (ref 5.0–7.5)

## 2019-01-23 MED ORDER — PAROXETINE HCL 40 MG PO TABS: 40.0000 mg | ORAL_TABLET | Freq: Every day | ORAL | 3 refills | Status: DC

## 2019-01-23 NOTE — Progress Notes (Signed)
Subjective:  Patient ID: Isaac [name] Alert, male    DOB: September 23, 1976  Age: 43 y.o. MRN: [id number]  CC: Annual Exam   HPI CLINTON DRAGONE presents for Annual physical  Depression screen [facility] 2/9 01/23/2019 09/24/2018 07/24/2018  Decreased Interest 0 0 0  Down, Depressed, Hopeless 0 0 0  PHQ - 2 Score 0 0 0    History Akili has a past medical history of Anxiety and Kidney stones.   He has a past surgical history that includes Wisdom tooth extraction (1995) and Extracorporeal shock wave lithotripsy (Left, 04/13/2017).   His family history includes Anxiety disorder in his daughter; Asthma in his brother and daughter; Diabetes in his father, paternal grandfather, and paternal grandmother; Hearing loss in his maternal grandfather; Heart disease in his father, paternal grandfather, and paternal grandmother; Hyperlipidemia in his father, paternal grandfather, and paternal grandmother; Hypertension in his father, paternal grandfather, and paternal grandmother.He reports that he quit smoking about 12 years ago. He has quit using smokeless tobacco.  His smokeless tobacco use included chew. He reports that he does not drink alcohol or use drugs.    ROS Review of Systems  Constitutional: Negative for activity change, fatigue and unexpected weight change.  HENT: Negative for congestion, ear pain, hearing loss, postnasal drip and trouble swallowing.   Eyes: Negative for pain and visual disturbance.  Respiratory: Negative for cough, chest tightness and shortness of breath.   Cardiovascular: Negative for chest pain, palpitations and leg swelling.  Gastrointestinal: Negative for abdominal distention, abdominal pain, blood in stool, constipation, diarrhea, nausea and vomiting.  Endocrine: Negative for cold intolerance, heat intolerance and polydipsia.  Genitourinary: Negative for difficulty urinating, dysuria, flank pain, frequency and urgency.  Musculoskeletal: Negative for arthralgias and joint swelling.    Skin: Negative for color change, rash and wound.  Neurological: Negative for dizziness, syncope, speech difficulty, weakness, light-headedness, numbness and headaches.  Hematological: Does not bruise/bleed easily.  Psychiatric/Behavioral: Negative for confusion, decreased concentration, dysphoric mood and sleep disturbance. The patient is not nervous/anxious.     Objective:  BP 112/74   Pulse 78   Temp (!) 95.5 F (35.3 C) (Oral)   Ht 5' 7"  (1.702 m)   Wt 179 lb 6 oz (81.4 kg)   BMI 28.09 kg/m   BP Readings from Last 3 Encounters:  01/23/19 112/74  09/24/18 124/89  07/24/18 125/88    Wt Readings from Last 3 Encounters:  01/23/19 179 lb 6 oz (81.4 kg)  09/24/18 179 lb (81.2 kg)  07/24/18 181 lb 8 oz (82.3 kg)     Physical Exam Constitutional:      Appearance: He is well-developed.  HENT:     Head: Normocephalic and atraumatic.  Eyes:     Pupils: Pupils are equal, round, and reactive to light.  Neck:     Musculoskeletal: Normal range of motion.     Thyroid: No thyromegaly.     Trachea: No tracheal deviation.  Cardiovascular:     Rate and Rhythm: Normal rate and regular rhythm.     Heart sounds: Normal heart sounds. No murmur. No friction rub. No gallop.   Pulmonary:     Breath sounds: Normal breath sounds. No wheezing or rales.  Abdominal:     General: Bowel sounds are normal. There is no distension.     Palpations: Abdomen is soft. There is no mass.     Tenderness: There is no abdominal tenderness.     Hernia: There is no hernia in the right  inguinal area or left inguinal area.  Genitourinary:    Penis: Normal.      Scrotum/Testes: Normal.  Musculoskeletal: Normal range of motion.  Lymphadenopathy:     Cervical: No cervical adenopathy.  Skin:    General: Skin is warm and dry.  Neurological:     Mental Status: He is alert and oriented to person, place, and time.       Assessment & Plan:   Liston was seen today for annual exam.  Diagnoses and all  orders for this visit:  Well adult exam -     CBC with Differential/Platelet -     CMP14+EGFR -     Lipid panel -     PSA, total and free -     VITAMIN D 25 Hydroxy (Vit-D Deficiency, Fractures) -     Urinalysis  Other orders -     PARoxetine (PAXIL) 40 MG tablet; Take 1 tablet (40 mg total) by mouth daily.       I have discontinued Landis A. Bledsoe's hydrocortisone, starch, and docusate sodium. I am also having him maintain his PARoxetine.  Allergies as of 01/23/2019   No Known Allergies     Medication List       Accurate as of January 23, 2019  3:12 PM. Always use your most recent med list.        PARoxetine 40 MG tablet Commonly known as:  PAXIL Take 1 tablet (40 mg total) by mouth daily.      Discussed diet, exercise. Ten lb weight loss recommended  Follow-up: Return in about 1 year (around 01/24/2020) for Wellness.  Claretta Fraise, M.D.

## 2019-01-23 NOTE — Patient Instructions (Signed)

## 2019-01-26 ENCOUNTER — Other Ambulatory Visit: Payer: 59

## 2019-01-26 DIAGNOSIS — Z0000 Encounter for general adult medical examination without abnormal findings: Secondary | ICD-10-CM | POA: Diagnosis not present

## 2019-01-28 LAB — CBC WITH DIFFERENTIAL/PLATELET
BASOS ABS: 0.1 10*3/uL (ref 0.0–0.2)
BASOS: 1 %
EOS (ABSOLUTE): 0.1 10*3/uL (ref 0.0–0.4)
EOS: 2 %
HEMATOCRIT: 43.2 % (ref 37.5–51.0)
Hemoglobin: 14.6 g/dL (ref 13.0–17.7)
IMMATURE GRANS (ABS): 0 10*3/uL (ref 0.0–0.1)
Immature Granulocytes: 0 %
LYMPHS: 21 %
Lymphocytes Absolute: 1 10*3/uL (ref 0.7–3.1)
MCH: 29.9 pg (ref 26.6–33.0)
MCHC: 33.8 g/dL (ref 31.5–35.7)
MCV: 89 fL (ref 79–97)
MONOCYTES: 9 %
Monocytes Absolute: 0.4 10*3/uL (ref 0.1–0.9)
NEUTROS ABS: 3.1 10*3/uL (ref 1.4–7.0)
Neutrophils: 67 %
Platelets: 232 10*3/uL (ref 150–450)
RBC: 4.88 x10E6/uL (ref 4.14–5.80)
RDW: 12.3 % (ref 11.6–15.4)
WBC: 4.7 10*3/uL (ref 3.4–10.8)

## 2019-01-28 LAB — CMP14+EGFR
ALBUMIN: 4.1 g/dL (ref 4.0–5.0)
ALT: 17 IU/L (ref 0–44)
AST: 15 IU/L (ref 0–40)
Albumin/Globulin Ratio: 1.7 (ref 1.2–2.2)
Alkaline Phosphatase: 68 IU/L (ref 39–117)
BUN / CREAT RATIO: 12 (ref 9–20)
BUN: 12 mg/dL (ref 6–24)
Bilirubin Total: 0.5 mg/dL (ref 0.0–1.2)
CALCIUM: 9.1 mg/dL (ref 8.7–10.2)
CO2: 26 mmol/L (ref 20–29)
Chloride: 105 mmol/L (ref 96–106)
Creatinine, Ser: 1 mg/dL (ref 0.76–1.27)
GFR calc Af Amer: 107 mL/min/{1.73_m2} (ref >59)
GFR, EST NON AFRICAN AMERICAN: 92 mL/min/{1.73_m2} (ref >59)
GLOBULIN, TOTAL: 2.4 g/dL (ref 1.5–4.5)
GLUCOSE: 81 mg/dL (ref 65–99)
Potassium: 4.5 mmol/L (ref 3.5–5.2)
SODIUM: 142 mmol/L (ref 134–144)
Total Protein: 6.5 g/dL (ref 6.0–8.5)

## 2019-01-28 LAB — PSA, TOTAL AND FREE
PSA FREE: 0.25 ng/mL
PSA, Free Pct: 50 %
Prostate Specific Ag, Serum: 0.5 ng/mL (ref 0.0–4.0)

## 2019-01-28 LAB — VITAMIN D 25 HYDROXY (VIT D DEFICIENCY, FRACTURES): VIT D 25 HYDROXY: 16.8 ng/mL — AB (ref 30.0–100.0)

## 2019-01-29 ENCOUNTER — Other Ambulatory Visit: Payer: Self-pay | Admitting: *Deleted

## 2019-01-29 MED ORDER — VITAMIN D (ERGOCALCIFEROL) 1.25 MG (50000 UNIT) PO CAPS: 50000.0000 [IU] | ORAL_CAPSULE | ORAL | 1 refills | Status: DC

## 2019-01-29 NOTE — Addendum Note (Signed)
Addended by: Almeta Monas on: 01/29/2019 04:12 PM   Modules accepted: Orders

## 2019-04-19 ENCOUNTER — Other Ambulatory Visit: Payer: Self-pay

## 2019-04-19 ENCOUNTER — Ambulatory Visit (INDEPENDENT_AMBULATORY_CARE_PROVIDER_SITE_OTHER): Payer: 59 | Admitting: Family Medicine

## 2019-04-19 DIAGNOSIS — Z20828 Contact with and (suspected) exposure to other viral communicable diseases: Secondary | ICD-10-CM | POA: Diagnosis not present

## 2019-04-19 DIAGNOSIS — Z20822 Contact with and (suspected) exposure to covid-19: Secondary | ICD-10-CM

## 2019-04-19 NOTE — Progress Notes (Signed)
    Subjective:    Patient ID: Isaac [name], male    DOB: [birth year], 43 y.o.   MRN: [id number]   HPI: Isaac [name] is a 43 y.o. male presenting for exposure to COVID on 4/26 by his mother in law. Has a little bit of a cough. Dry cough about 4 times a day. No myalgia. Denies chills sweats. Temp taken daily is normal. No dyspnea. No diarrhea. Wants to return to work.    Depression screen [facility] 2/9 01/23/2019 09/24/2018 07/24/2018 01/15/2018 01/03/2018  Decreased Interest 0 0 0 0 0  Down, Depressed, Hopeless 0 0 0 0 0  PHQ - 2 Score 0 0 0 0 0     Relevant past medical, surgical, family and social history reviewed and updated as indicated.  Interim medical history since our last visit reviewed. Allergies and medications reviewed and updated.  ROS:  Review of Systems  Constitutional: Negative for activity change, appetite change, chills, diaphoresis, fatigue and fever.  HENT: Negative.  Negative for congestion.   Respiratory: Positive for cough (slight). Negative for shortness of breath.   Cardiovascular: Negative for chest pain.  Musculoskeletal: Negative for arthralgias and myalgias.  Skin: Negative for rash.     Social History   Tobacco Use  Smoking Status Former Smoker  . Last attempt to quit: 01/11/2007  . Years since quitting: 12.2  Smokeless Tobacco Former Neurosurgeon  . Types: Chew       Objective:     Wt Readings from Last 3 Encounters:  01/23/19 179 lb 6 oz (81.4 kg)  09/24/18 179 lb (81.2 kg)  07/24/18 181 lb 8 oz (82.3 kg)     Exam deferred. Pt. Harboring due to COVID 19. Phone visit performed.   Assessment & Plan:   1. Close Exposure to Covid-19 Virus           Diagnoses and all orders for this visit:  Close Exposure to Covid-19 Virus  Patient was advised to self quarantine for a total of 2 weeks from the most recent known exposure.  At the end of that time if he is asymptomatic for at least 3 days he can return to work.  Based on today's evaluation it  looks like he could return to work as early as May 11.  Virtual Visit via telephone Note  I discussed the limitations, risks, security and privacy concerns of performing an evaluation and management service by telephone and the availability of in person appointments. The patient was identified with two identifiers. Pt.expressed understanding and agreed to proceed. Pt. Is at home. Dr. Darlyn Read is in his office.  Follow Up Instructions:   I discussed the assessment and treatment plan with the patient. The patient was provided an opportunity to ask questions and all were answered. The patient agreed with the plan and demonstrated an understanding of the instructions.   The patient was advised to call back or seek an in-person evaluation if the symptoms worsen or if the condition fails to improve as anticipated.   Total minutes including chart review and phone contact time: 10   Follow up plan: Return if symptoms worsen or fail to improve.  Mechele Claude, MD [facility]

## 2019-04-24 ENCOUNTER — Encounter: Payer: Self-pay | Admitting: Family Medicine

## 2019-07-28 ENCOUNTER — Other Ambulatory Visit: Payer: Self-pay | Admitting: Family Medicine

## 2019-08-06 ENCOUNTER — Encounter: Payer: Self-pay | Admitting: *Deleted

## 2019-12-16 ENCOUNTER — Ambulatory Visit: Payer: 59 | Attending: Internal Medicine

## 2019-12-16 DIAGNOSIS — Z20822 Contact with and (suspected) exposure to covid-19: Secondary | ICD-10-CM

## 2019-12-17 LAB — NOVEL CORONAVIRUS, NAA: SARS-CoV-2, NAA: NOT DETECTED

## 2020-01-23 ENCOUNTER — Encounter: Payer: 59 | Admitting: Family Medicine

## 2020-01-24 ENCOUNTER — Encounter: Payer: 59 | Admitting: Family Medicine

## 2020-01-31 ENCOUNTER — Other Ambulatory Visit: Payer: Self-pay

## 2020-02-03 ENCOUNTER — Encounter: Payer: Self-pay | Admitting: Family Medicine

## 2020-02-03 ENCOUNTER — Ambulatory Visit (INDEPENDENT_AMBULATORY_CARE_PROVIDER_SITE_OTHER): Payer: 59 | Admitting: Family Medicine

## 2020-02-03 ENCOUNTER — Other Ambulatory Visit: Payer: Self-pay

## 2020-02-03 VITALS — BP 135/84 | HR 93 | Temp 99.1°F | Ht 67.0 in | Wt 190.0 lb

## 2020-02-03 DIAGNOSIS — Z0001 Encounter for general adult medical examination with abnormal findings: Secondary | ICD-10-CM

## 2020-02-03 DIAGNOSIS — E559 Vitamin D deficiency, unspecified: Secondary | ICD-10-CM | POA: Diagnosis not present

## 2020-02-03 DIAGNOSIS — R200 Anesthesia of skin: Secondary | ICD-10-CM | POA: Diagnosis not present

## 2020-02-03 DIAGNOSIS — R202 Paresthesia of skin: Secondary | ICD-10-CM

## 2020-02-03 DIAGNOSIS — Z0000 Encounter for general adult medical examination without abnormal findings: Secondary | ICD-10-CM

## 2020-02-03 DIAGNOSIS — F418 Other specified anxiety disorders: Secondary | ICD-10-CM

## 2020-02-03 HISTORY — DX: Other specified anxiety disorders: F41.8

## 2020-02-03 MED ORDER — VITAMIN D (ERGOCALCIFEROL) 1.25 MG (50000 UNIT) PO CAPS: 50000.0000 [IU] | ORAL_CAPSULE | ORAL | 3 refills | Status: DC

## 2020-02-03 MED ORDER — TRAZODONE HCL 150 MG PO TABS: ORAL_TABLET | ORAL | 3 refills | Status: DC

## 2020-02-03 MED ORDER — TRAZODONE HCL 150 MG PO TABS: ORAL_TABLET | ORAL | 5 refills | Status: DC

## 2020-02-03 MED ORDER — PAROXETINE HCL 20 MG PO TABS: 20.0000 mg | ORAL_TABLET | Freq: Every day | ORAL | 3 refills | Status: DC

## 2020-02-03 MED ORDER — PAROXETINE HCL 20 MG PO TABS: 20.0000 mg | ORAL_TABLET | Freq: Every day | ORAL | 1 refills | Status: DC

## 2020-02-03 NOTE — Progress Notes (Signed)
Subjective:  Patient ID: Isaac [name], male    DOB: 04-28-1976  Age: 43 y.o. MRN: [id number]  CC: Annual Exam   HPI Isaac [name] presents for Annual CPE.   Depression screen [facility] 2/9 02/03/2020 01/23/2019 09/24/2018  Decreased Interest 0 0 0  Down, Depressed, Hopeless 0 0 0  PHQ - 2 Score 0 0 0    History Isaac [name] has a past medical history of Anxiety, Depression with anxiety (02/03/2020), and Kidney stones.   He has a past surgical history that includes Wisdom tooth extraction (1995) and Extracorporeal shock wave lithotripsy (Left, 04/13/2017).   His family history includes Anxiety disorder in his daughter; Asthma in his brother and daughter; Diabetes in his father, paternal grandfather, and paternal grandmother; Hearing loss in his maternal grandfather; Heart disease in his father, paternal grandfather, and paternal grandmother; Hyperlipidemia in his father, paternal grandfather, and paternal grandmother; Hypertension in his father, paternal grandfather, and paternal grandmother.He reports that he quit smoking about 13 years ago. He has quit using smokeless tobacco.  His smokeless tobacco use included chew. He reports that he does not drink alcohol or use drugs.    ROS Review of Systems  Constitutional: Negative for activity change, fatigue and unexpected weight change.  HENT: Positive for postnasal drip. Negative for congestion, ear pain, hearing loss and trouble swallowing.   Eyes: Negative for pain and visual disturbance.  Respiratory: Negative for cough, chest tightness and shortness of breath.   Cardiovascular: Negative for chest pain, palpitations and leg swelling.  Gastrointestinal: Negative for abdominal distention, abdominal pain, blood in stool, constipation, diarrhea, nausea and vomiting.  Endocrine: Negative for cold intolerance, heat intolerance and polydipsia.  Genitourinary: Negative for difficulty urinating, dysuria, flank pain, frequency and urgency.  Musculoskeletal:  Negative for arthralgias and joint swelling.  Skin: Negative for color change, rash and wound.  Neurological: Positive for numbness (both hands, left greater than right. ). Negative for dizziness, syncope, speech difficulty, weakness, light-headedness and headaches.  Hematological: Does not bruise/bleed easily.  Psychiatric/Behavioral: Positive for sleep disturbance (Gets to sleep okay, but awakens early and can't get back to sleep. ). Negative for confusion, decreased concentration and dysphoric mood. The patient is not nervous/anxious.     Objective:  BP 135/84   Pulse 93   Temp 99.1 F (37.3 C) (Temporal)   Ht _0  (1.702 m)   Wt 190 lb (86.2 kg)   BMI 29.76 kg/m   BP Readings from Last 3 Encounters:  02/03/20 135/84  01/23/19 112/74  09/24/18 124/89    Wt Readings from Last 3 Encounters:  02/03/20 190 lb (86.2 kg)  01/23/19 179 lb 6 oz (81.4 kg)  09/24/18 179 lb (81.2 kg)     Physical Exam Constitutional:      Appearance: He is well-developed.  HENT:     Head: Normocephalic and atraumatic.  Eyes:     Pupils: Pupils are equal, round, and reactive to light.  Neck:     Thyroid: No thyromegaly.     Trachea: No tracheal deviation.  Cardiovascular:     Rate and Rhythm: Normal rate and regular rhythm.     Heart sounds: Normal heart sounds. No murmur. No friction rub. No gallop.   Pulmonary:     Breath sounds: Normal breath sounds. No wheezing or rales.  Abdominal:     General: Bowel sounds are normal. There is no distension.     Palpations: Abdomen is soft. There is no mass.     Tenderness:  There is no abdominal tenderness.     Hernia: There is no hernia in the left inguinal area.  Genitourinary:    Penis: Normal.      Testes: Normal.  Musculoskeletal:        General: Normal range of motion.     Cervical back: Normal range of motion.  Lymphadenopathy:     Cervical: No cervical adenopathy.  Skin:    General: Skin is warm and dry.     Findings: Rash present.   Neurological:     Mental Status: He is [name] and oriented to person, place, and time.       Assessment & Plan:   Isaac [name] was seen today for annual exam.  Diagnoses and all orders for this visit:  Numbness and tingling in both hands -     Ambulatory referral to Neurology  Well adult exam -     CBC with Differential/Platelet -     CMP14+EGFR -     Lipid panel -     PSA Total (Reflex To Free) -     Urinalysis  Depression with anxiety  Vitamin D deficiency -     VITAMIN D 25 Hydroxy (Vit-D Deficiency, Fractures)  Other orders -     Discontinue: traZODone (DESYREL) 150 MG tablet; Use from 1/3 to 1 tablet nightly as needed for sleep. -     Discontinue: PARoxetine (PAXIL) 20 MG tablet; Take 1 tablet (20 mg total) by mouth daily. -     PARoxetine (PAXIL) 20 MG tablet; Take 1 tablet (20 mg total) by mouth daily. -     traZODone (DESYREL) 150 MG tablet; Use from 1/3 to 1 tablet nightly as needed for sleep. -     Vitamin D, Ergocalciferol, (DRISDOL) 1.25 MG (50000 UNIT) CAPS capsule; Take 1 capsule (50,000 Units total) by mouth every 7 (seven) days.       I have changed Isaac [name]'s Vitamin D (Ergocalciferol). I am also having him maintain his PARoxetine and traZODone.  Allergies as of 02/03/2020   No Known Allergies     Medication List       Accurate as of February 03, 2020 10:36 PM. If you have any questions, ask your nurse or doctor.        PARoxetine 20 MG tablet Commonly known as: PAXIL Take 1 tablet (20 mg total) by mouth daily. What changed:   medication strength  how much to take Changed by: Claretta Fraise, MD   traZODone 150 MG tablet Commonly known as: DESYREL Use from 1/3 to 1 tablet nightly as needed for sleep. Started by: Claretta Fraise, MD   Vitamin D (Ergocalciferol) 1.25 MG (50000 UNIT) Caps capsule Commonly known as: DRISDOL Take 1 capsule (50,000 Units total) by mouth every 7 (seven) days.        Follow-up: Return in about 1 year  (around 02/02/2021) for Compete physical.  Claretta Fraise, M.D.

## 2020-02-04 ENCOUNTER — Encounter: Payer: Self-pay | Admitting: Neurology

## 2020-02-04 ENCOUNTER — Other Ambulatory Visit: Payer: Self-pay

## 2020-02-04 DIAGNOSIS — R200 Anesthesia of skin: Secondary | ICD-10-CM

## 2020-02-06 ENCOUNTER — Other Ambulatory Visit: Payer: Self-pay

## 2020-02-06 ENCOUNTER — Other Ambulatory Visit: Payer: 59

## 2020-02-07 LAB — CBC WITH DIFFERENTIAL/PLATELET
Basophils Absolute: 0.1 10*3/uL (ref 0.0–0.2)
Basos: 1 %
EOS (ABSOLUTE): 0.1 10*3/uL (ref 0.0–0.4)
Eos: 1 %
Hematocrit: 45.1 % (ref 37.5–51.0)
Hemoglobin: 15.3 g/dL (ref 13.0–17.7)
Immature Grans (Abs): 0 10*3/uL (ref 0.0–0.1)
Immature Granulocytes: 0 %
Lymphocytes Absolute: 1.6 10*3/uL (ref 0.7–3.1)
Lymphs: 28 %
MCH: 30.6 pg (ref 26.6–33.0)
MCHC: 33.9 g/dL (ref 31.5–35.7)
MCV: 90 fL (ref 79–97)
Monocytes Absolute: 0.5 10*3/uL (ref 0.1–0.9)
Monocytes: 8 %
Neutrophils Absolute: 3.4 10*3/uL (ref 1.4–7.0)
Neutrophils: 62 %
Platelets: 268 10*3/uL (ref 150–450)
RBC: 5 x10E6/uL (ref 4.14–5.80)
RDW: 12.3 % (ref 11.6–15.4)
WBC: 5.5 10*3/uL (ref 3.4–10.8)

## 2020-02-07 LAB — CMP14+EGFR
ALT: 19 IU/L (ref 0–44)
AST: 20 IU/L (ref 0–40)
Albumin/Globulin Ratio: 1.8 (ref 1.2–2.2)
Albumin: 4.4 g/dL (ref 4.0–5.0)
Alkaline Phosphatase: 77 IU/L (ref 39–117)
BUN/Creatinine Ratio: 16 (ref 9–20)
BUN: 18 mg/dL (ref 6–24)
Bilirubin Total: 0.4 mg/dL (ref 0.0–1.2)
CO2: 23 mmol/L (ref 20–29)
Calcium: 9.5 mg/dL (ref 8.7–10.2)
Chloride: 100 mmol/L (ref 96–106)
Creatinine, Ser: 1.12 mg/dL (ref 0.76–1.27)
GFR calc Af Amer: 93 mL/min/{1.73_m2} (ref >59)
GFR calc non Af Amer: 80 mL/min/{1.73_m2} (ref >59)
Globulin, Total: 2.4 g/dL (ref 1.5–4.5)
Glucose: 90 mg/dL (ref 65–99)
Potassium: 4.4 mmol/L (ref 3.5–5.2)
Sodium: 136 mmol/L (ref 134–144)
Total Protein: 6.8 g/dL (ref 6.0–8.5)

## 2020-02-07 LAB — LIPID PANEL
Chol/HDL Ratio: 6 ratio — ABNORMAL HIGH (ref 0.0–5.0)
Cholesterol, Total: 223 mg/dL — ABNORMAL HIGH (ref 100–199)
HDL: 37 mg/dL — ABNORMAL LOW (ref >39)
LDL Chol Calc (NIH): 130 mg/dL — ABNORMAL HIGH (ref 0–99)
Triglycerides: 312 mg/dL — ABNORMAL HIGH (ref 0–149)
VLDL Cholesterol Cal: 56 mg/dL — ABNORMAL HIGH (ref 5–40)

## 2020-02-07 LAB — VITAMIN D 25 HYDROXY (VIT D DEFICIENCY, FRACTURES): Vit D, 25-Hydroxy: 17.1 ng/mL — ABNORMAL LOW (ref 30.0–100.0)

## 2020-02-07 LAB — PSA TOTAL (REFLEX TO FREE): Prostate Specific Ag, Serum: 2.3 ng/mL (ref 0.0–4.0)

## 2020-02-11 ENCOUNTER — Telehealth: Payer: Self-pay | Admitting: Family Medicine

## 2020-02-11 ENCOUNTER — Other Ambulatory Visit: Payer: Self-pay | Admitting: *Deleted

## 2020-02-11 MED ORDER — ATORVASTATIN CALCIUM 40 MG PO TABS: 40.0000 mg | ORAL_TABLET | Freq: Every day | ORAL | 1 refills | Status: DC

## 2020-02-11 NOTE — Telephone Encounter (Signed)
Pt called returning missed call from [place] regarding his lab results. Went over lab results with pt per Dr Darlyn Read notes. Pt voiced understanding and agreed to take the Atorvastatin 40mg  Rx for cholesterol. Scheduled pt a 6 mth ck up with Dr on 08/03/20. Pt aware to come in a few days before to have lab work done while fasting.

## 2020-02-18 ENCOUNTER — Other Ambulatory Visit: Payer: Self-pay | Admitting: Family Medicine

## 2020-02-18 ENCOUNTER — Encounter: Payer: Self-pay | Admitting: Family Medicine

## 2020-02-18 MED ORDER — BELSOMRA 10 MG PO TABS: 10.0000 mg | ORAL_TABLET | Freq: Every day | ORAL | 2 refills | Status: DC

## 2020-02-19 ENCOUNTER — Other Ambulatory Visit: Payer: Self-pay

## 2020-02-19 ENCOUNTER — Ambulatory Visit (INDEPENDENT_AMBULATORY_CARE_PROVIDER_SITE_OTHER): Payer: 59 | Admitting: Neurology

## 2020-02-19 DIAGNOSIS — R200 Anesthesia of skin: Secondary | ICD-10-CM | POA: Diagnosis not present

## 2020-02-19 DIAGNOSIS — R202 Paresthesia of skin: Secondary | ICD-10-CM | POA: Diagnosis not present

## 2020-02-19 DIAGNOSIS — G5621 Lesion of ulnar nerve, right upper limb: Secondary | ICD-10-CM

## 2020-02-19 NOTE — Procedures (Signed)
[facility]  [address] [place], [address]  [place], [place] [address] Tel: [phone] Fax:  [phone] Test Date:  02/19/2020  Patient: Isaac [name] DOB: 29-Jan-1976 Physician: Nita Sickle, DO  Sex: Male Height: 5\' 7"  Ref Phys: , MD  ID#: Mechele Claude Temp: 34.0C Technician:    Patient Complaints: This is a 44 year old man referred for evaluation of bilateral arm tingling involving the forearm and last 2 fingers.  NCV & EMG Findings: Extensive electrodiagnostic testing of the right upper extremity and additional studies of the left shows:  1. Bilateral median, ulnar, and mixed palmar sensory responses are within normal limits.  Of note, the right ulnar sensory response is asymmetrically reduced as compared to the left. 2. Bilateral median and left ulnar motor responses are within normal limits.  Right ulnar motor response shows slowed conduction velocity across the elbow (A Elbow-B Elbow, 45 m/s).   3. There is no evidence of active or chronic motor axonal loss changes affecting any of the tested muscles.  Motor unit configuration and recruitment pattern is within normal limits.    Impression: 1. Right ulnar neuropathy with slowing across the elbow, purely demyelinating, mild. 2. There is no evidence of left ulnar neuropathy, carpal tunnel syndrome or cervical radiculopathy affecting either upper extremity.   ___________________________ 55, DO    Nerve Conduction Studies Anti Sensory Summary Table   Stim Site NR Peak (ms) Norm Peak (ms) P-T Amp (V) Norm P-T Amp  Left Median Anti Sensory (2nd Digit)  34C  Wrist    2.7 <3.4 50.3 >20  Right Median Anti Sensory (2nd Digit)  34C  Wrist    2.5 <3.4 44.7 >20  Left Ulnar Anti Sensory (5th Digit)  34C  Wrist    2.7 <3.1 47.1 >12  Right Ulnar Anti Sensory (5th Digit)  34C  Wrist    2.5 <3.1 29.4 >12   Motor Summary Table   Stim Site NR Onset (ms) Norm Onset (ms) O-P Amp (mV) Norm O-P Amp Site1  Site2 Delta-0 (ms) Dist (cm) Vel (m/s) Norm Vel (m/s)  Left Median Motor (Abd Poll Brev)  34C  Wrist    2.7 <3.9 11.6 >6 Elbow Wrist 4.6 29.0 63 >50  Elbow    7.3  11.0         Right Median Motor (Abd Poll Brev)  34C  Wrist    2.4 <3.9 13.0 >6 Elbow Wrist 4.2 29.0 69 >50  Elbow    6.6  12.8         Left Ulnar Motor (Abd Dig Minimi)  34C  Wrist    2.2 <3.1 12.3 >7 B Elbow Wrist 3.3 23.0 70 >50  B Elbow    5.5  12.0  A Elbow B Elbow 1.5 10.0 67 >50  A Elbow    7.0  11.6         Right Ulnar Motor (Abd Dig Minimi)  34C  Wrist    2.1 <3.1 11.3 >7 B Elbow Wrist 3.6 23.0 64 >50  B Elbow    5.7  10.8  A Elbow B Elbow 2.2 10.0 45 >50  A Elbow    7.9  10.3          Comparison Summary Table   Stim Site NR Peak (ms) Norm Peak (ms) P-T Amp (V) Site1 Site2 Delta-P (ms) Norm Delta (ms)  Left Median/Ulnar Palm Comparison (Wrist - 8cm)  34C  Median Palm    1.5 <2.2 80.7 Median Palm  Ulnar Palm 0.1   Ulnar Palm    1.4 <2.2 30.1      Right Median/Ulnar Palm Comparison (Wrist - 8cm)  34C  Median Palm    1.4 <2.2 70.1 Median Palm Ulnar Palm 0.0   Ulnar Palm    1.4 <2.2 21.4       EMG   Side Muscle Ins Act Fibs Psw Fasc Number Recrt Dur Dur. Amp Amp. Poly Poly. Comment  Right 1stDorInt Nml Nml Nml Nml Nml Nml Nml Nml Nml Nml Nml Nml N/A  Right PronatorTeres Nml Nml Nml Nml Nml Nml Nml Nml Nml Nml Nml Nml N/A  Right Biceps Nml Nml Nml Nml Nml Nml Nml Nml Nml Nml Nml Nml N/A  Right Triceps Nml Nml Nml Nml Nml Nml Nml Nml Nml Nml Nml Nml N/A  Right Deltoid Nml Nml Nml Nml Nml Nml Nml Nml Nml Nml Nml Nml N/A  Right FlexCarpiUln Nml Nml Nml Nml Nml Nml Nml Nml Nml Nml Nml Nml N/A  Left 1stDorInt Nml Nml Nml Nml Nml Nml Nml Nml Nml Nml Nml Nml N/A  Left PronatorTeres Nml Nml Nml Nml Nml Nml Nml Nml Nml Nml Nml Nml N/A  Left Biceps Nml Nml Nml Nml Nml Nml Nml Nml Nml Nml Nml Nml N/A  Left Triceps Nml Nml Nml Nml Nml Nml Nml Nml Nml Nml Nml Nml N/A  Left Deltoid Nml Nml Nml Nml Nml Nml Nml Nml Nml Nml  Nml Nml N/A      Waveforms:

## 2020-02-21 ENCOUNTER — Other Ambulatory Visit: Payer: Self-pay | Admitting: Family Medicine

## 2020-02-21 DIAGNOSIS — G5621 Lesion of ulnar nerve, right upper limb: Secondary | ICD-10-CM

## 2020-02-21 MED ORDER — PREDNISONE 10 MG PO TABS: ORAL_TABLET | ORAL | 0 refills | Status: DC

## 2020-07-31 ENCOUNTER — Other Ambulatory Visit: Payer: 59

## 2020-07-31 ENCOUNTER — Other Ambulatory Visit: Payer: Self-pay

## 2020-07-31 DIAGNOSIS — Z0000 Encounter for general adult medical examination without abnormal findings: Secondary | ICD-10-CM

## 2020-08-01 LAB — CMP14+EGFR
ALT: 50 IU/L — ABNORMAL HIGH (ref 0–44)
AST: 23 IU/L (ref 0–40)
Albumin/Globulin Ratio: 2 (ref 1.2–2.2)
Albumin: 4.5 g/dL (ref 4.0–5.0)
Alkaline Phosphatase: 78 IU/L (ref 48–121)
BUN/Creatinine Ratio: 18 (ref 9–20)
BUN: 19 mg/dL (ref 6–24)
Bilirubin Total: 0.4 mg/dL (ref 0.0–1.2)
CO2: 25 mmol/L (ref 20–29)
Calcium: 9.3 mg/dL (ref 8.7–10.2)
Chloride: 104 mmol/L (ref 96–106)
Creatinine, Ser: 1.06 mg/dL (ref 0.76–1.27)
GFR calc Af Amer: 98 mL/min/{1.73_m2} (ref >59)
GFR calc non Af Amer: 85 mL/min/{1.73_m2} (ref >59)
Globulin, Total: 2.3 g/dL (ref 1.5–4.5)
Glucose: 129 mg/dL — ABNORMAL HIGH (ref 65–99)
Potassium: 4.4 mmol/L (ref 3.5–5.2)
Sodium: 139 mmol/L (ref 134–144)
Total Protein: 6.8 g/dL (ref 6.0–8.5)

## 2020-08-01 LAB — LIPID PANEL
Chol/HDL Ratio: 4.1 ratio (ref 0.0–5.0)
Cholesterol, Total: 155 mg/dL (ref 100–199)
HDL: 38 mg/dL — ABNORMAL LOW (ref >39)
LDL Chol Calc (NIH): 95 mg/dL (ref 0–99)
Triglycerides: 122 mg/dL (ref 0–149)
VLDL Cholesterol Cal: 22 mg/dL (ref 5–40)

## 2020-08-01 LAB — CBC WITH DIFFERENTIAL/PLATELET
Basophils Absolute: 0.1 10*3/uL (ref 0.0–0.2)
Basos: 1 %
EOS (ABSOLUTE): 0 10*3/uL (ref 0.0–0.4)
Eos: 0 %
Hematocrit: 43 % (ref 37.5–51.0)
Hemoglobin: 14.9 g/dL (ref 13.0–17.7)
Immature Grans (Abs): 0 10*3/uL (ref 0.0–0.1)
Immature Granulocytes: 0 %
Lymphocytes Absolute: 1.3 10*3/uL (ref 0.7–3.1)
Lymphs: 18 %
MCH: 31.2 pg (ref 26.6–33.0)
MCHC: 34.7 g/dL (ref 31.5–35.7)
MCV: 90 fL (ref 79–97)
Monocytes Absolute: 0.3 10*3/uL (ref 0.1–0.9)
Monocytes: 5 %
Neutrophils Absolute: 5.4 10*3/uL (ref 1.4–7.0)
Neutrophils: 76 %
Platelets: 225 10*3/uL (ref 150–450)
RBC: 4.77 x10E6/uL (ref 4.14–5.80)
RDW: 12 % (ref 11.6–15.4)
WBC: 7.1 10*3/uL (ref 3.4–10.8)

## 2020-08-03 ENCOUNTER — Encounter: Payer: Self-pay | Admitting: Family Medicine

## 2020-08-03 ENCOUNTER — Ambulatory Visit (INDEPENDENT_AMBULATORY_CARE_PROVIDER_SITE_OTHER): Payer: 59 | Admitting: Family Medicine

## 2020-08-03 ENCOUNTER — Other Ambulatory Visit: Payer: Self-pay

## 2020-08-03 VITALS — BP 123/87 | HR 82 | Temp 97.7°F | Resp 20 | Ht 67.0 in | Wt 184.2 lb

## 2020-08-03 DIAGNOSIS — F418 Other specified anxiety disorders: Secondary | ICD-10-CM | POA: Diagnosis not present

## 2020-08-03 DIAGNOSIS — E559 Vitamin D deficiency, unspecified: Secondary | ICD-10-CM | POA: Diagnosis not present

## 2020-08-03 NOTE — Progress Notes (Signed)
Subjective:  Patient ID: Isaac [name], male    DOB: 18-Oct-1976  Age: 44 y.o. MRN: [id number]  CC: Medical Management of Chronic Issues   HPI KORREY SCHLEICHER presents for follow-up of elevated cholesterol. Doing well without complaints on current medication. Denies side effects of statin including myalgia and arthralgia and nausea. Also in today for liver function testing. Currently no chest pain, shortness of breath or other cardiovascular related symptoms noted.  He continues to use Paxil but has been symptoms.  He does not want to start tapering it further today but he is open to doing that in the future.  He is concerned about premature ejaculation that occurred once before when he tried to stop taking the medicine.  Patient is now using the Belsomra as needed.  He says he has not had to take it in a couple of months now. History Abdiel has a past medical history of Anxiety, Depression with anxiety (02/03/2020), and Kidney stones.   He has a past surgical history that includes Wisdom tooth extraction (1995) and Extracorporeal shock wave lithotripsy (Left, 04/13/2017).   His family history includes Anxiety disorder in his daughter; Asthma in his brother and daughter; Diabetes in his father, paternal grandfather, and paternal grandmother; Hearing loss in his maternal grandfather; Heart disease in his father, paternal grandfather, and paternal grandmother; Hyperlipidemia in his father, paternal grandfather, and paternal grandmother; Hypertension in his father, paternal grandfather, and paternal grandmother.He reports that he quit smoking about 13 years ago. He has quit using smokeless tobacco.  His smokeless tobacco use included chew. He reports that he does not drink alcohol and does not use drugs.  Current Outpatient Medications on File Prior to Visit  Medication Sig Dispense Refill  . atorvastatin (LIPITOR) 40 MG tablet Take 1 tablet (40 mg total) by mouth daily. With supper 90 tablet 1  .  PARoxetine (PAXIL) 20 MG tablet Take 1 tablet (20 mg total) by mouth daily. 90 tablet 3  . Suvorexant (BELSOMRA) 10 MG TABS Take 10 mg by mouth at bedtime. 30 tablet 2  . Vitamin D, Ergocalciferol, (DRISDOL) 1.25 MG (50000 UNIT) CAPS capsule Take 1 capsule (50,000 Units total) by mouth every 7 (seven) days. 13 capsule 3   No current facility-administered medications on file prior to visit.    ROS Review of Systems  Objective:  BP 123/87   Pulse 82   Temp 97.7 F (36.5 C) (Temporal)   Resp 20   Ht 5\' 7"  (1.702 m)   Wt 184 lb 4 oz (83.6 kg)   SpO2 95%   BMI 28.86 kg/m   BP Readings from Last 3 Encounters:  08/03/20 123/87  02/03/20 135/84  01/23/19 112/74    Wt Readings from Last 3 Encounters:  08/03/20 184 lb 4 oz (83.6 kg)  02/03/20 190 lb (86.2 kg)  01/23/19 179 lb 6 oz (81.4 kg)     Physical Exam  Lab Results  Component Value Date   HGBA1C 5.5 01/20/2018    Lab Results  Component Value Date   WBC 7.1 07/31/2020   HGB 14.9 07/31/2020   HCT 43.0 07/31/2020   PLT 225 07/31/2020   GLUCOSE 129 (H) 07/31/2020   CHOL 155 07/31/2020   TRIG 122 07/31/2020   HDL 38 (L) 07/31/2020   LDLCALC 95 07/31/2020   ALT 50 (H) 07/31/2020   AST 23 07/31/2020   NA 139 07/31/2020   K 4.4 07/31/2020   CL 104 07/31/2020   CREATININE 1.06 07/31/2020  BUN 19 07/31/2020   CO2 25 07/31/2020   TSH 0.992 01/20/2018   HGBA1C 5.5 01/20/2018    DG Abd 1 View  Result Date: 04/13/2017 CLINICAL DATA:  Preoperative evaluation for upcoming left ureteral stone manipulation EXAM: ABDOMEN - 1 VIEW COMPARISON:  04/05/2017 FINDINGS: Scattered large and small bowel gas is noted. No renal calculi are seen. Stable calcification left hemipelvis is noted consistent with the given clinical history. No other focal abnormality is seen. IMPRESSION: Stable distal left ureteral calculus. Electronically Signed   By: Alcide Clever M.D.   On: 04/13/2017 08:29    Assessment & Plan:   Trevious was seen  today for medical management of chronic issues.  Diagnoses and all orders for this visit:  Depression with anxiety  Vitamin D deficiency   I have discontinued Cristian A. Philbert's predniSONE. I am also having him maintain his PARoxetine, Vitamin D (Ergocalciferol), atorvastatin, and Belsomra.  No orders of the defined types were placed in this encounter.    Follow-up: Return in about 6 months (around 02/03/2021).  Mechele Claude, M.D.

## 2020-08-24 ENCOUNTER — Other Ambulatory Visit: Payer: Self-pay | Admitting: Family Medicine

## 2020-12-08 ENCOUNTER — Encounter: Payer: Self-pay | Admitting: *Deleted

## 2021-02-01 ENCOUNTER — Other Ambulatory Visit: Payer: 59

## 2021-02-01 DIAGNOSIS — Z0000 Encounter for general adult medical examination without abnormal findings: Secondary | ICD-10-CM

## 2021-02-02 ENCOUNTER — Other Ambulatory Visit: Payer: Self-pay

## 2021-02-02 ENCOUNTER — Encounter: Payer: Self-pay | Admitting: Family Medicine

## 2021-02-02 ENCOUNTER — Ambulatory Visit (INDEPENDENT_AMBULATORY_CARE_PROVIDER_SITE_OTHER): Payer: 59 | Admitting: Family Medicine

## 2021-02-02 VITALS — BP 120/80 | HR 72 | Temp 98.3°F | Resp 20 | Ht 67.0 in | Wt 189.0 lb

## 2021-02-02 DIAGNOSIS — E559 Vitamin D deficiency, unspecified: Secondary | ICD-10-CM

## 2021-02-02 DIAGNOSIS — Z23 Encounter for immunization: Secondary | ICD-10-CM | POA: Diagnosis not present

## 2021-02-02 DIAGNOSIS — Z0000 Encounter for general adult medical examination without abnormal findings: Secondary | ICD-10-CM

## 2021-02-02 DIAGNOSIS — F418 Other specified anxiety disorders: Secondary | ICD-10-CM | POA: Diagnosis not present

## 2021-02-02 DIAGNOSIS — J301 Allergic rhinitis due to pollen: Secondary | ICD-10-CM

## 2021-02-02 DIAGNOSIS — Z0001 Encounter for general adult medical examination with abnormal findings: Secondary | ICD-10-CM | POA: Diagnosis not present

## 2021-02-02 DIAGNOSIS — E782 Mixed hyperlipidemia: Secondary | ICD-10-CM | POA: Diagnosis not present

## 2021-02-02 LAB — CMP14+EGFR
ALT: 22 IU/L (ref 0–44)
AST: 19 IU/L (ref 0–40)
Albumin/Globulin Ratio: 1.8 (ref 1.2–2.2)
Albumin: 4.3 g/dL (ref 4.0–5.0)
Alkaline Phosphatase: 79 IU/L (ref 44–121)
BUN/Creatinine Ratio: 10 (ref 9–20)
BUN: 11 mg/dL (ref 6–24)
Bilirubin Total: 0.5 mg/dL (ref 0.0–1.2)
CO2: 22 mmol/L (ref 20–29)
Calcium: 9.2 mg/dL (ref 8.7–10.2)
Chloride: 103 mmol/L (ref 96–106)
Creatinine, Ser: 1.06 mg/dL (ref 0.76–1.27)
GFR calc Af Amer: 98 mL/min/{1.73_m2} (ref >59)
GFR calc non Af Amer: 85 mL/min/{1.73_m2} (ref >59)
Globulin, Total: 2.4 g/dL (ref 1.5–4.5)
Glucose: 98 mg/dL (ref 65–99)
Potassium: 4.4 mmol/L (ref 3.5–5.2)
Sodium: 140 mmol/L (ref 134–144)
Total Protein: 6.7 g/dL (ref 6.0–8.5)

## 2021-02-02 LAB — CBC WITH DIFFERENTIAL/PLATELET
Basophils Absolute: 0.1 10*3/uL (ref 0.0–0.2)
Basos: 1 %
EOS (ABSOLUTE): 0.1 10*3/uL (ref 0.0–0.4)
Eos: 2 %
Hematocrit: 44.8 % (ref 37.5–51.0)
Hemoglobin: 15.1 g/dL (ref 13.0–17.7)
Immature Grans (Abs): 0 10*3/uL (ref 0.0–0.1)
Immature Granulocytes: 0 %
Lymphocytes Absolute: 1.6 10*3/uL (ref 0.7–3.1)
Lymphs: 28 %
MCH: 30.3 pg (ref 26.6–33.0)
MCHC: 33.7 g/dL (ref 31.5–35.7)
MCV: 90 fL (ref 79–97)
Monocytes Absolute: 0.5 10*3/uL (ref 0.1–0.9)
Monocytes: 8 %
Neutrophils Absolute: 3.3 10*3/uL (ref 1.4–7.0)
Neutrophils: 61 %
Platelets: 239 10*3/uL (ref 150–450)
RBC: 4.99 x10E6/uL (ref 4.14–5.80)
RDW: 12.1 % (ref 11.6–15.4)
WBC: 5.5 10*3/uL (ref 3.4–10.8)

## 2021-02-02 LAB — LIPID PANEL
Chol/HDL Ratio: 5.8 ratio — ABNORMAL HIGH (ref 0.0–5.0)
Cholesterol, Total: 213 mg/dL — ABNORMAL HIGH (ref 100–199)
HDL: 37 mg/dL — ABNORMAL LOW (ref >39)
LDL Chol Calc (NIH): 137 mg/dL — ABNORMAL HIGH (ref 0–99)
Triglycerides: 218 mg/dL — ABNORMAL HIGH (ref 0–149)
VLDL Cholesterol Cal: 39 mg/dL (ref 5–40)

## 2021-02-02 MED ORDER — ROSUVASTATIN CALCIUM 10 MG PO TABS: 10.0000 mg | ORAL_TABLET | Freq: Every day | ORAL | 1 refills | Status: DC

## 2021-02-02 MED ORDER — PAROXETINE HCL 40 MG PO TABS: 40.0000 mg | ORAL_TABLET | Freq: Every day | ORAL | 3 refills | Status: DC

## 2021-02-02 MED ORDER — FEXOFENADINE HCL 180 MG PO TABS: 180.0000 mg | ORAL_TABLET | Freq: Every day | ORAL | 11 refills | Status: DC

## 2021-02-02 NOTE — Progress Notes (Signed)
Subjective:  Patient ID: Isaac [name], male    DOB: 01/12/1976  Age: 45 y.o. MRN: [id number]  CC: Annual Exam   HPI Isaac [name] presents for low back pain relieved after 2-3 days off of atorvastatin.  5-6/10. Came back when Isaac [name] resumed.   Depression screen [facility] 2/9 02/02/2021 08/03/2020 02/03/2020  Decreased Interest 0 0 0  Down, Depressed, Hopeless 0 0 0  PHQ - 2 Score 0 0 0    Having worry out of proportion to the stressor. "Makes a mountain out of a mole hill sayss his wife.     History Isaac [name] has a past medical history of Anxiety, Depression with anxiety (02/03/2020), and Kidney stones.   Isaac [name] has a past surgical history that includes Wisdom tooth extraction (1995); Extracorporeal shock wave lithotripsy (Left, 04/13/2017); and Elbow surgery (Left).   His family history includes Anxiety disorder in his daughter; Asthma in his brother and daughter; Diabetes in his father, paternal grandfather, and paternal grandmother; Hearing loss in his maternal grandfather; Heart disease in his father, paternal grandfather, and paternal grandmother; Hyperlipidemia in his father, paternal grandfather, and paternal grandmother; Hypertension in his father, paternal grandfather, and paternal grandmother.Isaac [name] reports that Isaac [name] quit smoking about 14 years ago. Isaac [name] has quit using smokeless tobacco.  His smokeless tobacco use included chew. Isaac [name] reports that Isaac [name] does not drink alcohol and does not use drugs.    ROS Review of Systems  HENT: Positive for postnasal drip (clearing throat a lot AM & evening).     Objective:  BP 120/80   Pulse 72   Temp 98.3 F (36.8 C)   Resp 20   Ht 5\' 7"  (1.702 m)   Wt 189 lb (85.7 kg)   SpO2 97%   BMI 29.60 kg/m   BP Readings from Last 3 Encounters:  02/02/21 120/80  08/03/20 123/87  02/03/20 135/84    Wt Readings from Last 3 Encounters:  02/02/21 189 lb (85.7 kg)  08/03/20 184 lb 4 oz (83.6 kg)  02/03/20 190 lb (86.2 kg)     Physical Exam Constitutional:       Appearance: Isaac [name] is well-developed and well-nourished.  HENT:     Head: Normocephalic and atraumatic.     Mouth/Throat:     Mouth: Oropharynx is clear and moist.  Eyes:     Extraocular Movements: EOM normal.     Pupils: Pupils are equal, round, and reactive to light.  Neck:     Thyroid: No thyromegaly.     Trachea: No tracheal deviation.  Cardiovascular:     Rate and Rhythm: Normal rate and regular rhythm.     Heart sounds: Normal heart sounds. No murmur heard. No friction rub. No gallop.   Pulmonary:     Breath sounds: Normal breath sounds. No wheezing or rales.  Abdominal:     General: Bowel sounds are normal. There is no distension.     Palpations: Abdomen is soft. There is no mass.     Tenderness: There is no abdominal tenderness.     Hernia: There is no hernia in the right inguinal area or left inguinal area.  Genitourinary:    Penis: Normal.      Testes: Normal.  Musculoskeletal:        General: No edema. Normal range of motion.     Cervical back: Normal range of motion.  Lymphadenopathy:     Cervical: No cervical adenopathy.  Skin:    General: Skin is warm and dry.  Neurological:     Mental Status: Isaac [name] is alert and oriented to person, place, and time.  Psychiatric:        Mood and Affect: Mood and affect normal.       Assessment & Plan:   Isaac [name] was seen today for annual exam.  Diagnoses and all orders for this visit:  Well adult exam  Depression with anxiety -     PARoxetine (PAXIL) 40 MG tablet; Take 1 tablet (40 mg total) by mouth daily.  Mixed hyperlipidemia -     rosuvastatin (CRESTOR) 10 MG tablet; Take 1 tablet (10 mg total) by mouth daily. For cholesterol  Vitamin D deficiency -     VITAMIN D 25 Hydroxy (Vit-D Deficiency, Fractures)  Seasonal allergic rhinitis due to pollen -     fexofenadine (ALLEGRA) 180 MG tablet; Take 1 tablet (180 mg total) by mouth daily. For allergy symptoms       I have changed Xayne A. Vanputten's PARoxetine. I am also  having him start on fexofenadine and rosuvastatin. Additionally, I am having him maintain his Vitamin D (Ergocalciferol), Belsomra, and atorvastatin.  Allergies as of 02/02/2021   No Known Allergies     Medication List       Accurate as of February 02, 2021  3:12 PM. If you have any questions, ask your nurse or doctor.        atorvastatin 40 MG tablet Commonly known as: LIPITOR TAKE 1 TABLET (40 MG TOTAL) BY MOUTH DAILY. WITH SUPPER   Belsomra 10 MG Tabs Generic drug: Suvorexant Take 10 mg by mouth at bedtime.   fexofenadine 180 MG tablet Commonly known as: ALLEGRA Take 1 tablet (180 mg total) by mouth daily. For allergy symptoms Started by: Mechele Claude, MD   PARoxetine 40 MG tablet Commonly known as: PAXIL Take 1 tablet (40 mg total) by mouth daily. What changed:   medication strength  how much to take Changed by: Mechele Claude, MD   rosuvastatin 10 MG tablet Commonly known as: Crestor Take 1 tablet (10 mg total) by mouth daily. For cholesterol Started by: Mechele Claude, MD   Vitamin D (Ergocalciferol) 1.25 MG (50000 UNIT) Caps capsule Commonly known as: DRISDOL Take 1 capsule (50,000 Units total) by mouth every 7 (seven) days.        Follow-up: Return in about 6 months (around 08/02/2021).  Mechele Claude, M.D.

## 2021-02-02 NOTE — Addendum Note (Signed)
Addended by: Magdalene River on: 02/02/2021 04:18 PM   Modules accepted: Orders

## 2021-02-03 ENCOUNTER — Encounter: Payer: 59 | Admitting: Family Medicine

## 2021-02-04 LAB — PSA, TOTAL AND FREE
PSA, Free Pct: 56 %
PSA, Free: 0.28 ng/mL
Prostate Specific Ag, Serum: 0.5 ng/mL (ref 0.0–4.0)

## 2021-02-04 LAB — SPECIMEN STATUS REPORT

## 2021-02-08 NOTE — Progress Notes (Signed)
Hello Menachem,  Your lab result is normal and/or stable.Some minor variations that are not significant are commonly marked abnormal, but do not represent any medical problem for you.  Best regards, Mechele Claude, M.D.

## 2021-02-22 ENCOUNTER — Other Ambulatory Visit: Payer: Self-pay | Admitting: Family Medicine

## 2021-02-24 ENCOUNTER — Other Ambulatory Visit: Payer: Self-pay | Admitting: Family Medicine

## 2021-04-13 ENCOUNTER — Ambulatory Visit (INDEPENDENT_AMBULATORY_CARE_PROVIDER_SITE_OTHER): Payer: 59 | Admitting: Nurse Practitioner

## 2021-04-13 DIAGNOSIS — U071 COVID-19: Secondary | ICD-10-CM | POA: Diagnosis not present

## 2021-04-13 MED ORDER — NAPROXEN 500 MG PO TABS: 500.0000 mg | ORAL_TABLET | Freq: Two times a day (BID) | ORAL | 0 refills | Status: DC

## 2021-04-13 MED ORDER — BENZONATATE 100 MG PO CAPS: 100.0000 mg | ORAL_CAPSULE | Freq: Three times a day (TID) | ORAL | 0 refills | Status: DC | PRN

## 2021-04-13 NOTE — Progress Notes (Signed)
Virtual Visit  Note Due to COVID-19 pandemic this visit was conducted virtually. This visit type was conducted due to national recommendations for restrictions regarding the COVID-19 Pandemic (e.g. social distancing, sheltering in place) in an effort to limit this patient's exposure and mitigate transmission in our community. All issues noted in this document were discussed and addressed.  A physical exam was not performed with this format.  I connected with Isaac [name] on 04/13/21 at 1:30 by telephone and verified that I am speaking with the correct person using two identifiers. Isaac [name] is currently located at home and no one is currently with  him during visit. The provider, Mary-Margaret Daphine Deutscher, FNP is located in their office at time of visit.  I discussed the limitations, risks, security and privacy concerns of performing an evaluation and management service by telephone and the availability of in person appointments. I also discussed with the patient that there may be a patient responsible charge related to this service. The patient expressed understanding and agreed to proceed.   History and Present Illness:   Chief Complaint: Covid Positive   HPI Patient calls in having body aches and sore throat on SUnday. Yesterday he felt worse. He felt like he had the flu. Took at home test which was positive for covid.    Review of Systems  Constitutional: Positive for chills, fever and malaise/fatigue.  HENT: Positive for congestion.   Respiratory: Positive for cough.   Musculoskeletal: Positive for myalgias.  Neurological: Negative for dizziness and headaches.     Observations/Objective: Alert and oriented- answers all questions appropriately No distress Voice hoarse No cough noted   Assessment and Plan: Isaac [name] in today with chief complaint of Covid Positive   1. Lab test positive for detection of COVID-19 virus 1. Take meds as prescribed 2. Use a cool mist  humidifier especially during the winter months and when heat has been humid. 3. Use saline nose sprays frequently 4. Saline irrigations of the nose can be very helpful if done frequently.  * 4X daily for 1 week*  * Use of a nettie pot can be helpful with this. Follow directions with this* 5. Drink plenty of fluids 6. Keep thermostat turn down low 7.For any cough or congestion  Tessalon perles as prescribed 8. For fever or aces or pains- take tylenol or ibuprofen appropriate for age and weight.  * for fevers greater than 101 orally you may alternate ibuprofen and tylenol every  3 hours.    - naproxen (NAPROSYN) 500 MG tablet; Take 1 tablet (500 mg total) by mouth 2 (two) times daily with a meal.  Dispense: 60 tablet; Refill: 0 - benzonatate (TESSALON PERLES) 100 MG capsule; Take 1 capsule (100 mg total) by mouth 3 (three) times daily as needed for cough.  Dispense: 20 capsule; Refill: 0     Follow Up Instructions: prn    I discussed the assessment and treatment plan with the patient. The patient was provided an opportunity to ask questions and all were answered. The patient agreed with the plan and demonstrated an understanding of the instructions.   The patient was advised to call back or seek an in-person evaluation if the symptoms worsen or if the condition fails to improve as anticipated.  The above assessment and management plan was discussed with the patient. The patient verbalized understanding of and has agreed to the management plan. Patient is aware to call the clinic if symptoms persist or worsen. Patient is  aware when to return to the clinic for a follow-up visit. Patient educated on when it is appropriate to go to the emergency department.   Time call ended:  1:42  I provided 12 minutes of  non face-to-face time during this encounter.    Mary-Margaret Daphine Deutscher, FNP

## 2021-04-20 ENCOUNTER — Telehealth: Payer: Self-pay

## 2021-04-20 NOTE — Telephone Encounter (Signed)
OTC meds with decongestant should be fine as well as flonase nasal spray.

## 2021-04-20 NOTE — Telephone Encounter (Signed)
Left detailed message on patients voicemail instructions given by provider

## 2021-06-05 ENCOUNTER — Other Ambulatory Visit: Payer: Self-pay | Admitting: Family Medicine

## 2021-06-05 DIAGNOSIS — E782 Mixed hyperlipidemia: Secondary | ICD-10-CM

## 2021-08-17 ENCOUNTER — Encounter: Payer: Self-pay | Admitting: Family Medicine

## 2021-08-25 ENCOUNTER — Ambulatory Visit: Payer: 59 | Admitting: Family Medicine

## 2021-08-25 ENCOUNTER — Encounter: Payer: Self-pay | Admitting: Family Medicine

## 2021-08-25 ENCOUNTER — Other Ambulatory Visit: Payer: Self-pay

## 2021-08-25 VITALS — BP 126/81 | HR 85 | Temp 97.9°F | Ht 67.0 in | Wt 191.6 lb

## 2021-08-25 DIAGNOSIS — R030 Elevated blood-pressure reading, without diagnosis of hypertension: Secondary | ICD-10-CM

## 2021-08-25 NOTE — Patient Instructions (Signed)

## 2021-08-25 NOTE — Progress Notes (Signed)
Subjective:  Patient ID: Isaac [name], male    DOB: 1976/01/10  Age: 45 y.o. MRN: [id number]  CC: Hypertension   HPI Isaac [name] presents for MUltiple elevated blood pressures check first at the dentist at 160/103. Then at home several readings with diastolic 36-468 and systolic 032-122. No symptom of chest pain.   Depression screen [facility] 2/9 08/25/2021 02/02/2021 08/03/2020  Decreased Interest 0 0 0  Down, Depressed, Hopeless 0 0 0  PHQ - 2 Score 0 0 0    History Isaac [name] has a past medical history of Anxiety, Depression with anxiety (02/03/2020), and Kidney stones.   Isaac [name] has a past surgical history that includes Wisdom tooth extraction (1995); Extracorporeal shock wave lithotripsy (Left, 04/13/2017); and Elbow surgery (Left).   His family history includes Anxiety disorder in his daughter; Asthma in his brother and daughter; Diabetes in his father, paternal grandfather, and paternal grandmother; Hearing loss in his maternal grandfather; Heart disease in his father, paternal grandfather, and paternal grandmother; Hyperlipidemia in his father, paternal grandfather, and paternal grandmother; Hypertension in his father, paternal grandfather, and paternal grandmother.Isaac [name] reports that Isaac [name] has quit smoking. Isaac [name] has quit using smokeless tobacco.  His smokeless tobacco use included chew. Isaac [name] reports that Isaac [name] does not drink alcohol and does not use drugs.    ROS Review of Systems  Constitutional:  Negative for fever.  Respiratory:  Negative for shortness of breath.   Cardiovascular:  Negative for chest pain.  Musculoskeletal:  Negative for arthralgias.  Skin:  Negative for rash.   Objective:  BP 126/81   Pulse 85   Temp 97.9 F (36.6 C)   Ht 5' 7"  (1.702 m)   Wt 191 lb 9.6 oz (86.9 kg)   SpO2 95%   BMI 30.01 kg/m   BP Readings from Last 3 Encounters:  08/25/21 126/81  02/02/21 120/80  08/03/20 123/87    Wt Readings from Last 3 Encounters:  08/25/21 191 lb 9.6 oz (86.9 kg)  02/02/21 189  lb (85.7 kg)  08/03/20 184 lb 4 oz (83.6 kg)     Physical Exam Vitals reviewed.  Constitutional:      Appearance: Isaac [name] is well-developed.  HENT:     Head: Normocephalic and atraumatic.     Right Ear: External ear normal.     Left Ear: External ear normal.     Mouth/Throat:     Pharynx: No oropharyngeal exudate or posterior oropharyngeal erythema.  Eyes:     Pupils: Pupils are equal, round, and reactive to light.  Cardiovascular:     Rate and Rhythm: Normal rate and regular rhythm.     Heart sounds: No murmur heard. Pulmonary:     Effort: No respiratory distress.     Breath sounds: Normal breath sounds.  Musculoskeletal:     Cervical back: Normal range of motion and neck supple.  Neurological:     Mental Status: Isaac [name] is [name] and oriented to person, place, and time.      Assessment & Plan:   [name] was seen today for hypertension.  Diagnoses and all orders for this visit:  Elevated blood pressure reading -     BMP8+EGFR   Follow DASH Diet. Continue to monitor home BP. Goal is 130/80 or below.    I have discontinued Isaac [name]'s benzonatate. I am also having him maintain his Belsomra, fexofenadine, PARoxetine, Vitamin D (Ergocalciferol), naproxen, and rosuvastatin.  Allergies as of 08/25/2021   No Known Allergies      Medication  List        Accurate as of August 25, 2021 11:59 PM. If you have any questions, ask your nurse or doctor.          STOP taking these medications    benzonatate 100 MG capsule Commonly known as: Best boy Stopped by: Claretta Fraise, MD       TAKE these medications    Belsomra 10 MG Tabs Generic drug: Suvorexant Take 10 mg by mouth at bedtime.   fexofenadine 180 MG tablet Commonly known as: ALLEGRA Take 1 tablet (180 mg total) by mouth daily. For allergy symptoms   naproxen 500 MG tablet Commonly known as: Naprosyn Take 1 tablet (500 mg total) by mouth 2 (two) times daily with a meal.   PARoxetine 40 MG  tablet Commonly known as: PAXIL Take 1 tablet (40 mg total) by mouth daily.   rosuvastatin 10 MG tablet Commonly known as: CRESTOR TAKE 1 TABLET BY MOUTH  DAILY FOR CHOLESTEROL   Vitamin D (Ergocalciferol) 1.25 MG (50000 UNIT) Caps capsule Commonly known as: DRISDOL TAKE 1 CAPSULE (50,000 UNITS TOTAL) BY MOUTH EVERY 7 (SEVEN) DAYS.         Follow-up: Return in about 6 months (around 02/22/2022), or if symptoms worsen or fail to improve.  Claretta Fraise, M.D.

## 2021-08-26 LAB — BMP8+EGFR
BUN/Creatinine Ratio: 12 (ref 9–20)
BUN: 14 mg/dL (ref 6–24)
CO2: 25 mmol/L (ref 20–29)
Calcium: 9.5 mg/dL (ref 8.7–10.2)
Chloride: 100 mmol/L (ref 96–106)
Creatinine, Ser: 1.13 mg/dL (ref 0.76–1.27)
Glucose: 94 mg/dL (ref 65–99)
Potassium: 4.5 mmol/L (ref 3.5–5.2)
Sodium: 141 mmol/L (ref 134–144)
eGFR: 82 mL/min/{1.73_m2} (ref >59)

## 2021-08-28 NOTE — Progress Notes (Signed)
Hello Isaac [name],  Your lab result is normal and/or stable.Some minor variations that are not significant are commonly marked abnormal, but do not represent any medical problem for you.  Best regards, Robertt Buda, M.D.

## 2021-08-29 ENCOUNTER — Encounter: Payer: Self-pay | Admitting: Family Medicine

## 2021-08-29 ENCOUNTER — Other Ambulatory Visit: Payer: Self-pay | Admitting: Family Medicine

## 2021-08-29 DIAGNOSIS — E782 Mixed hyperlipidemia: Secondary | ICD-10-CM

## 2021-10-12 ENCOUNTER — Other Ambulatory Visit: Payer: Self-pay | Admitting: Nurse Practitioner

## 2021-10-12 MED ORDER — OSELTAMIVIR PHOSPHATE 75 MG PO CAPS: 75.0000 mg | ORAL_CAPSULE | Freq: Every day | ORAL | 0 refills | Status: DC

## 2021-10-12 NOTE — Progress Notes (Signed)
prophylaxis treatment,  family member tested positive for flu

## 2021-11-22 ENCOUNTER — Other Ambulatory Visit: Payer: Self-pay | Admitting: Family Medicine

## 2021-11-22 DIAGNOSIS — F418 Other specified anxiety disorders: Secondary | ICD-10-CM

## 2022-01-12 ENCOUNTER — Telehealth: Payer: 59 | Admitting: Nurse Practitioner

## 2022-01-12 ENCOUNTER — Encounter: Payer: Self-pay | Admitting: Nurse Practitioner

## 2022-01-12 DIAGNOSIS — R0981 Nasal congestion: Secondary | ICD-10-CM

## 2022-01-12 DIAGNOSIS — R058 Other specified cough: Secondary | ICD-10-CM

## 2022-01-12 LAB — VERITOR FLU A/B WAIVED
Influenza A: NEGATIVE
Influenza B: NEGATIVE

## 2022-01-12 NOTE — Progress Notes (Signed)
° °  Virtual Visit  Note Due to COVID-19 pandemic this visit was conducted virtually. This visit type was conducted due to national recommendations for restrictions regarding the COVID-19 Pandemic (e.g. social distancing, sheltering in place) in an effort to limit this patient's exposure and mitigate transmission in our community. All issues noted in this document were discussed and addressed.  A physical exam was not performed with this format.  I connected with Isaac [name] on 01/12/22 at 12:00 pm  by telephone and verified that I am speaking with the correct person using two identifiers. Isaac [name] is currently located at home during visit. The provider, Daryll Drown, NP is located in their office at time of visit.  I discussed the limitations, risks, security and privacy concerns of performing an evaluation and management service by telephone and the availability of in person appointments. I also discussed with the patient that there may be a patient responsible charge related to this service. The patient expressed understanding and agreed to proceed.   History and Present Illness:  URI  This is a new problem. The problem has been unchanged. There has been no fever. Associated symptoms include congestion, coughing and a sore throat. Pertinent negatives include no ear pain. He has tried nothing for the symptoms.     Review of Systems  Constitutional:  Positive for malaise/fatigue. Negative for fever.  HENT:  Positive for congestion and sore throat. Negative for ear pain.   Respiratory:  Positive for cough.   Skin: Negative.   All other systems reviewed and are negative.   Observations/Objective: Tele visit patient not in distress  Assessment and Plan: Take meds as prescribed - Use a cool mist humidifier  -Use saline nose sprays frequently -Force fluids -For fever or aches or pains- take Tylenol or ibuprofen. -completed flu/Covid-19 swab results pending Follow up with  worsening unresolved symptoms   Follow Up Instructions: With worsening or unresolved symptoms    I discussed the assessment and treatment plan with the patient. The patient was provided an opportunity to ask questions and all were answered. The patient agreed with the plan and demonstrated an understanding of the instructions.   The patient was advised to call back or seek an in-person evaluation if the symptoms worsen or if the condition fails to improve as anticipated.  The above assessment and management plan was discussed with the patient. The patient verbalized understanding of and has agreed to the management plan. Patient is aware to call the clinic if symptoms persist or worsen. Patient is aware when to return to the clinic for a follow-up visit. Patient educated on when it is appropriate to go to the emergency department.   Time call ended:  12:00 pm   I provided 10 minutes of  non face-to-face time during this encounter.    Daryll Drown, NP

## 2022-01-13 LAB — SARS-COV-2, NAA 2 DAY TAT

## 2022-01-13 LAB — NOVEL CORONAVIRUS, NAA: SARS-CoV-2, NAA: NOT DETECTED

## 2022-02-07 ENCOUNTER — Encounter: Payer: Self-pay | Admitting: Family Medicine

## 2022-02-07 ENCOUNTER — Ambulatory Visit (INDEPENDENT_AMBULATORY_CARE_PROVIDER_SITE_OTHER): Payer: 59 | Admitting: Family Medicine

## 2022-02-07 VITALS — BP 130/93 | HR 79 | Temp 98.1°F | Ht 67.0 in | Wt 195.4 lb

## 2022-02-07 DIAGNOSIS — E782 Mixed hyperlipidemia: Secondary | ICD-10-CM

## 2022-02-07 DIAGNOSIS — N401 Enlarged prostate with lower urinary tract symptoms: Secondary | ICD-10-CM

## 2022-02-07 DIAGNOSIS — R351 Nocturia: Secondary | ICD-10-CM

## 2022-02-07 DIAGNOSIS — M25522 Pain in left elbow: Secondary | ICD-10-CM

## 2022-02-07 DIAGNOSIS — E559 Vitamin D deficiency, unspecified: Secondary | ICD-10-CM | POA: Diagnosis not present

## 2022-02-07 DIAGNOSIS — F418 Other specified anxiety disorders: Secondary | ICD-10-CM

## 2022-02-07 DIAGNOSIS — J301 Allergic rhinitis due to pollen: Secondary | ICD-10-CM

## 2022-02-07 DIAGNOSIS — Z0001 Encounter for general adult medical examination with abnormal findings: Secondary | ICD-10-CM

## 2022-02-07 DIAGNOSIS — R030 Elevated blood-pressure reading, without diagnosis of hypertension: Secondary | ICD-10-CM

## 2022-02-07 DIAGNOSIS — Z0000 Encounter for general adult medical examination without abnormal findings: Secondary | ICD-10-CM

## 2022-02-07 LAB — LIPID PANEL

## 2022-02-07 MED ORDER — ROSUVASTATIN CALCIUM 10 MG PO TABS: ORAL_TABLET | ORAL | 3 refills | Status: DC

## 2022-02-07 MED ORDER — PAROXETINE HCL 40 MG PO TABS: 40.0000 mg | ORAL_TABLET | Freq: Every day | ORAL | 3 refills | Status: DC

## 2022-02-07 MED ORDER — FEXOFENADINE HCL 180 MG PO TABS: 180.0000 mg | ORAL_TABLET | Freq: Every day | ORAL | 11 refills | Status: DC

## 2022-02-07 NOTE — Progress Notes (Signed)
Subjective:  Patient ID: Isaac [name], male    DOB: Dec 25, 1975  Age: 46 y.o. MRN: [id number]  CC: Annual Exam   HPI Isaac [name] presents for Complete physical.  Depression screen [facility] 2/9 02/07/2022 08/25/2021 02/02/2021  Decreased Interest 0 0 0  Down, Depressed, Hopeless 0 0 0  PHQ - 2 Score 0 0 0  Altered sleeping 0 - -  Tired, decreased energy 0 - -  Change in appetite 0 - -  Feeling bad or failure about yourself  0 - -  Trouble concentrating 0 - -  Moving slowly or fidgety/restless 0 - -  Suicidal thoughts 0 - -  PHQ-9 Score 0 - -    History Isaac [name] has a past medical history of Anxiety, Depression with anxiety (02/03/2020), and Kidney stones.   Isaac [name] has a past surgical history that includes Wisdom tooth extraction (1995); Extracorporeal shock wave lithotripsy (Left, 04/13/2017); and Elbow surgery (Left).   His family history includes Anxiety disorder in his daughter; Asthma in his brother and daughter; Diabetes in his father, paternal grandfather, and paternal grandmother; Hearing loss in his maternal grandfather; Heart disease in his father, paternal grandfather, and paternal grandmother; Hyperlipidemia in his father, paternal grandfather, and paternal grandmother; Hypertension in his father, paternal grandfather, and paternal grandmother.Isaac [name] reports that Isaac [name] has quit smoking. Isaac [name] has quit using smokeless tobacco.  His smokeless tobacco use included chew. Isaac [name] reports that Isaac [name] does not drink alcohol and does not use drugs.    ROS Review of Systems  Constitutional:  Negative for activity change, fatigue and unexpected weight change.  HENT:  Negative for congestion, ear pain, hearing loss, postnasal drip and trouble swallowing.   Eyes:  Negative for pain and visual disturbance.  Respiratory:  Negative for cough, chest tightness and shortness of breath.   Cardiovascular:  Negative for chest pain, palpitations and leg swelling.  Gastrointestinal:  Negative for abdominal distention,  abdominal pain, blood in stool, constipation, diarrhea, nausea and vomiting.  Endocrine: Negative for cold intolerance, heat intolerance and polydipsia.  Genitourinary:  Negative for difficulty urinating, dysuria, flank pain, frequency and urgency.  Musculoskeletal:  Negative for arthralgias and joint swelling.  Skin:  Negative for color change, rash and wound.  Neurological:  Negative for dizziness, syncope, speech difficulty, weakness, light-headedness, numbness and headaches.  Hematological:  Does not bruise/bleed easily.  Psychiatric/Behavioral:  Negative for confusion, decreased concentration, dysphoric mood and sleep disturbance. The patient is not nervous/anxious.    Objective:  BP (!) 130/93    Pulse 79    Temp 98.1 F (36.7 C) (Temporal)    Ht _0  (1.702 m)    Wt 195 lb 6.4 oz (88.6 kg)    BMI 30.60 kg/m   BP Readings from Last 3 Encounters:  02/07/22 (!) 130/93  08/25/21 126/81  02/02/21 120/80    Wt Readings from Last 3 Encounters:  02/07/22 195 lb 6.4 oz (88.6 kg)  08/25/21 191 lb 9.6 oz (86.9 kg)  02/02/21 189 lb (85.7 kg)     Physical Exam Constitutional:      Appearance: Isaac [name] is well-developed.  HENT:     Head: Normocephalic and atraumatic.  Eyes:     Pupils: Pupils are equal, round, and reactive to light.  Neck:     Thyroid: No thyromegaly.     Trachea: No tracheal deviation.  Cardiovascular:     Rate and Rhythm: Normal rate and regular rhythm.     Heart sounds: Normal heart sounds. No murmur  heard.   No friction rub. No gallop.  Pulmonary:     Breath sounds: Normal breath sounds. No wheezing or rales.  Abdominal:     General: Bowel sounds are normal. There is no distension.     Palpations: Abdomen is soft. There is no mass.     Tenderness: There is no abdominal tenderness.     Hernia: There is no hernia in the left inguinal area.  Genitourinary:    Penis: Normal.      Testes: Normal.  Musculoskeletal:        General: Normal range of motion.      Cervical back: Normal range of motion.  Lymphadenopathy:     Cervical: No cervical adenopathy.  Skin:    General: Skin is warm and dry.  Neurological:     Mental Status: Isaac [name] is [name] and oriented to person, place, and time.      Assessment & Plan:   Isaac [name] was seen today for annual exam.  Diagnoses and all orders for this visit:  Elevated blood pressure reading -     CBC with Differential/Platelet -     CMP14+EGFR  Well adult exam -     CBC with Differential/Platelet -     CMP14+EGFR -     Lipid panel -     VITAMIN D 25 Hydroxy (Vit-D Deficiency, Fractures)  Mixed hyperlipidemia -     Lipid panel -     rosuvastatin (CRESTOR) 10 MG tablet; TAKE 1 TABLET BY MOUTH  DAILY FOR CHOLESTEROL  Vitamin D deficiency -     VITAMIN D 25 Hydroxy (Vit-D Deficiency, Fractures)  Depression with anxiety -     PARoxetine (PAXIL) 40 MG tablet; Take 1 tablet (40 mg total) by mouth daily.  Seasonal allergic rhinitis due to pollen -     fexofenadine (ALLEGRA) 180 MG tablet; Take 1 tablet (180 mg total) by mouth daily. For allergy symptoms  Left elbow pain  Benign prostatic hyperplasia with nocturia -     PSA, total and free       I have discontinued Isaac [name]'s Belsomra, Vitamin D (Ergocalciferol), naproxen, and oseltamivir. I have also changed his PARoxetine. Additionally, I am having him maintain his rosuvastatin and fexofenadine.  Allergies as of 02/07/2022   No Known Allergies      Medication List        Accurate as of February 07, 2022 10:33 AM. If you have any questions, ask your nurse or doctor.          STOP taking these medications    Belsomra 10 MG Tabs Generic drug: Suvorexant Stopped by: Claretta Fraise, MD   naproxen 500 MG tablet Commonly known as: Naprosyn Stopped by: Claretta Fraise, MD   oseltamivir 75 MG capsule Commonly known as: Tamiflu Stopped by: Claretta Fraise, MD   Vitamin D (Ergocalciferol) 1.25 MG (50000 UNIT) Caps capsule Commonly  known as: DRISDOL Stopped by: Claretta Fraise, MD       TAKE these medications    fexofenadine 180 MG tablet Commonly known as: ALLEGRA Take 1 tablet (180 mg total) by mouth daily. For allergy symptoms   PARoxetine 40 MG tablet Commonly known as: PAXIL Take 1 tablet (40 mg total) by mouth daily.   rosuvastatin 10 MG tablet Commonly known as: CRESTOR TAKE 1 TABLET BY MOUTH  DAILY FOR CHOLESTEROL         Follow-up: Return in about 1 year (around 02/07/2023).  Claretta Fraise, M.D.

## 2022-02-08 LAB — CMP14+EGFR
ALT: 17 IU/L (ref 0–44)
AST: 18 IU/L (ref 0–40)
Albumin/Globulin Ratio: 1.9 (ref 1.2–2.2)
Albumin: 4.5 g/dL (ref 4.0–5.0)
Alkaline Phosphatase: 92 IU/L (ref 44–121)
BUN/Creatinine Ratio: 12 (ref 9–20)
BUN: 13 mg/dL (ref 6–24)
Bilirubin Total: 0.5 mg/dL (ref 0.0–1.2)
CO2: 25 mmol/L (ref 20–29)
Calcium: 9.2 mg/dL (ref 8.7–10.2)
Chloride: 104 mmol/L (ref 96–106)
Creatinine, Ser: 1.12 mg/dL (ref 0.76–1.27)
Globulin, Total: 2.4 g/dL (ref 1.5–4.5)
Glucose: 97 mg/dL (ref 70–99)
Potassium: 4.7 mmol/L (ref 3.5–5.2)
Sodium: 140 mmol/L (ref 134–144)
Total Protein: 6.9 g/dL (ref 6.0–8.5)
eGFR: 83 mL/min/{1.73_m2} (ref >59)

## 2022-02-08 LAB — CBC WITH DIFFERENTIAL/PLATELET
Basophils Absolute: 0.1 10*3/uL (ref 0.0–0.2)
Basos: 1 %
EOS (ABSOLUTE): 0.1 10*3/uL (ref 0.0–0.4)
Eos: 2 %
Hematocrit: 45.6 % (ref 37.5–51.0)
Hemoglobin: 14.9 g/dL (ref 13.0–17.7)
Immature Grans (Abs): 0 10*3/uL (ref 0.0–0.1)
Immature Granulocytes: 0 %
Lymphocytes Absolute: 1.4 10*3/uL (ref 0.7–3.1)
Lymphs: 31 %
MCH: 28.9 pg (ref 26.6–33.0)
MCHC: 32.7 g/dL (ref 31.5–35.7)
MCV: 89 fL (ref 79–97)
Monocytes Absolute: 0.3 10*3/uL (ref 0.1–0.9)
Monocytes: 7 %
Neutrophils Absolute: 2.7 10*3/uL (ref 1.4–7.0)
Neutrophils: 59 %
Platelets: 249 10*3/uL (ref 150–450)
RBC: 5.15 x10E6/uL (ref 4.14–5.80)
RDW: 12.3 % (ref 11.6–15.4)
WBC: 4.6 10*3/uL (ref 3.4–10.8)

## 2022-02-08 LAB — LIPID PANEL
Chol/HDL Ratio: 4.6 ratio (ref 0.0–5.0)
Cholesterol, Total: 173 mg/dL (ref 100–199)
HDL: 38 mg/dL — ABNORMAL LOW (ref >39)
LDL Chol Calc (NIH): 99 mg/dL (ref 0–99)
Triglycerides: 206 mg/dL — ABNORMAL HIGH (ref 0–149)
VLDL Cholesterol Cal: 36 mg/dL (ref 5–40)

## 2022-02-08 LAB — PSA, TOTAL AND FREE
PSA, Free Pct: 20.7 %
PSA, Free: 0.29 ng/mL
Prostate Specific Ag, Serum: 1.4 ng/mL (ref 0.0–4.0)

## 2022-02-08 LAB — VITAMIN D 25 HYDROXY (VIT D DEFICIENCY, FRACTURES): Vit D, 25-Hydroxy: 22.9 ng/mL — ABNORMAL LOW (ref 30.0–100.0)

## 2022-02-08 MED ORDER — VITAMIN D (ERGOCALCIFEROL) 1.25 MG (50000 UNIT) PO CAPS: 50000.0000 [IU] | ORAL_CAPSULE | ORAL | 3 refills | Status: DC

## 2022-02-08 NOTE — Addendum Note (Signed)
Addended by: Christia Reading on: 02/08/2022 10:43 AM   Modules accepted: Orders

## 2022-02-08 NOTE — Progress Notes (Signed)
Dear Isaac [name], Your Vitamin D is  low. You need a prescription strength supplement I will send that in for you. Nurse, if at all possible, could you send in a prescription for the patient for vitamin D 50,000 units, 1 p.o. weekly #13 with 3 refills? Many thanks, WS

## 2022-02-15 ENCOUNTER — Other Ambulatory Visit: Payer: Self-pay | Admitting: Family Medicine

## 2022-02-15 DIAGNOSIS — E782 Mixed hyperlipidemia: Secondary | ICD-10-CM

## 2022-02-15 DIAGNOSIS — F418 Other specified anxiety disorders: Secondary | ICD-10-CM

## 2022-07-25 ENCOUNTER — Other Ambulatory Visit: Payer: Self-pay | Admitting: *Deleted

## 2022-07-25 DIAGNOSIS — J301 Allergic rhinitis due to pollen: Secondary | ICD-10-CM

## 2022-07-25 MED ORDER — FEXOFENADINE HCL 180 MG PO TABS: 180.0000 mg | ORAL_TABLET | Freq: Every day | ORAL | 1 refills | Status: DC

## 2022-07-25 MED ORDER — VITAMIN D (ERGOCALCIFEROL) 1.25 MG (50000 UNIT) PO CAPS: 50000.0000 [IU] | ORAL_CAPSULE | ORAL | 1 refills | Status: DC

## 2022-12-26 ENCOUNTER — Other Ambulatory Visit: Payer: Self-pay | Admitting: Family Medicine

## 2022-12-26 DIAGNOSIS — J301 Allergic rhinitis due to pollen: Secondary | ICD-10-CM

## 2023-01-28 ENCOUNTER — Other Ambulatory Visit: Payer: Self-pay | Admitting: Family Medicine

## 2023-01-28 DIAGNOSIS — F418 Other specified anxiety disorders: Secondary | ICD-10-CM

## 2023-01-28 DIAGNOSIS — E782 Mixed hyperlipidemia: Secondary | ICD-10-CM

## 2023-02-06 ENCOUNTER — Other Ambulatory Visit: Payer: Self-pay | Admitting: *Deleted

## 2023-02-06 ENCOUNTER — Telehealth: Payer: Self-pay | Admitting: Family Medicine

## 2023-02-06 DIAGNOSIS — E782 Mixed hyperlipidemia: Secondary | ICD-10-CM

## 2023-02-06 DIAGNOSIS — E559 Vitamin D deficiency, unspecified: Secondary | ICD-10-CM

## 2023-02-06 DIAGNOSIS — Z125 Encounter for screening for malignant neoplasm of prostate: Secondary | ICD-10-CM

## 2023-02-06 DIAGNOSIS — R030 Elevated blood-pressure reading, without diagnosis of hypertension: Secondary | ICD-10-CM

## 2023-02-06 DIAGNOSIS — Z0000 Encounter for general adult medical examination without abnormal findings: Secondary | ICD-10-CM

## 2023-02-06 NOTE — Telephone Encounter (Signed)
Labs ordered.

## 2023-02-08 ENCOUNTER — Other Ambulatory Visit: Payer: 59

## 2023-02-08 DIAGNOSIS — R030 Elevated blood-pressure reading, without diagnosis of hypertension: Secondary | ICD-10-CM

## 2023-02-08 DIAGNOSIS — Z0000 Encounter for general adult medical examination without abnormal findings: Secondary | ICD-10-CM

## 2023-02-08 DIAGNOSIS — Z125 Encounter for screening for malignant neoplasm of prostate: Secondary | ICD-10-CM

## 2023-02-08 DIAGNOSIS — E559 Vitamin D deficiency, unspecified: Secondary | ICD-10-CM

## 2023-02-08 DIAGNOSIS — E782 Mixed hyperlipidemia: Secondary | ICD-10-CM

## 2023-02-08 LAB — URINALYSIS
Bilirubin, UA: NEGATIVE
Glucose, UA: NEGATIVE
Leukocytes,UA: NEGATIVE
Nitrite, UA: NEGATIVE
Protein,UA: NEGATIVE
RBC, UA: NEGATIVE
Specific Gravity, UA: 1.025 (ref 1.005–1.030)
Urobilinogen, Ur: 0.2 mg/dL (ref 0.2–1.0)
pH, UA: 5.5 (ref 5.0–7.5)

## 2023-02-08 LAB — LIPID PANEL

## 2023-02-08 LAB — BAYER DCA HB A1C WAIVED: HB A1C (BAYER DCA - WAIVED): 5.7 % — ABNORMAL HIGH (ref 4.8–5.6)

## 2023-02-09 LAB — CMP14+EGFR
ALT: 26 IU/L (ref 0–44)
AST: 21 IU/L (ref 0–40)
Albumin/Globulin Ratio: 1.8 (ref 1.2–2.2)
Albumin: 4.6 g/dL (ref 4.1–5.1)
Alkaline Phosphatase: 90 IU/L (ref 44–121)
BUN/Creatinine Ratio: 8 — ABNORMAL LOW (ref 9–20)
BUN: 9 mg/dL (ref 6–24)
Bilirubin Total: 0.5 mg/dL (ref 0.0–1.2)
CO2: 21 mmol/L (ref 20–29)
Calcium: 8.6 mg/dL — ABNORMAL LOW (ref 8.7–10.2)
Chloride: 104 mmol/L (ref 96–106)
Creatinine, Ser: 1.17 mg/dL (ref 0.76–1.27)
Globulin, Total: 2.6 g/dL (ref 1.5–4.5)
Glucose: 99 mg/dL (ref 70–99)
Potassium: 4.5 mmol/L (ref 3.5–5.2)
Sodium: 138 mmol/L (ref 134–144)
Total Protein: 7.2 g/dL (ref 6.0–8.5)
eGFR: 78 mL/min/{1.73_m2} (ref >59)

## 2023-02-09 LAB — CBC WITH DIFFERENTIAL/PLATELET
Basophils Absolute: 0.1 10*3/uL (ref 0.0–0.2)
Basos: 1 %
EOS (ABSOLUTE): 0.1 10*3/uL (ref 0.0–0.4)
Eos: 1 %
Hematocrit: 45.3 % (ref 37.5–51.0)
Hemoglobin: 15.1 g/dL (ref 13.0–17.7)
Immature Grans (Abs): 0 10*3/uL (ref 0.0–0.1)
Immature Granulocytes: 0 %
Lymphocytes Absolute: 1.5 10*3/uL (ref 0.7–3.1)
Lymphs: 29 %
MCH: 30.1 pg (ref 26.6–33.0)
MCHC: 33.3 g/dL (ref 31.5–35.7)
MCV: 90 fL (ref 79–97)
Monocytes Absolute: 0.4 10*3/uL (ref 0.1–0.9)
Monocytes: 8 %
Neutrophils Absolute: 3 10*3/uL (ref 1.4–7.0)
Neutrophils: 61 %
Platelets: 265 10*3/uL (ref 150–450)
RBC: 5.02 x10E6/uL (ref 4.14–5.80)
RDW: 12.4 % (ref 11.6–15.4)
WBC: 5 10*3/uL (ref 3.4–10.8)

## 2023-02-09 LAB — PSA, TOTAL AND FREE
PSA, Free Pct: 54 %
PSA, Free: 0.27 ng/mL
Prostate Specific Ag, Serum: 0.5 ng/mL (ref 0.0–4.0)

## 2023-02-09 LAB — LIPID PANEL
Chol/HDL Ratio: 4.6 ratio (ref 0.0–5.0)
Cholesterol, Total: 188 mg/dL (ref 100–199)
HDL: 41 mg/dL (ref >39)
LDL Chol Calc (NIH): 114 mg/dL — ABNORMAL HIGH (ref 0–99)
Triglycerides: 188 mg/dL — ABNORMAL HIGH (ref 0–149)
VLDL Cholesterol Cal: 33 mg/dL (ref 5–40)

## 2023-02-09 LAB — VITAMIN D 25 HYDROXY (VIT D DEFICIENCY, FRACTURES): Vit D, 25-Hydroxy: 50.3 ng/mL (ref 30.0–100.0)

## 2023-02-15 ENCOUNTER — Ambulatory Visit (INDEPENDENT_AMBULATORY_CARE_PROVIDER_SITE_OTHER): Payer: 59 | Admitting: Family Medicine

## 2023-02-15 ENCOUNTER — Encounter: Payer: Self-pay | Admitting: Family Medicine

## 2023-02-15 VITALS — BP 131/80 | HR 90 | Temp 98.0°F | Ht 67.0 in | Wt 192.4 lb

## 2023-02-15 DIAGNOSIS — J301 Allergic rhinitis due to pollen: Secondary | ICD-10-CM | POA: Diagnosis not present

## 2023-02-15 DIAGNOSIS — R072 Precordial pain: Secondary | ICD-10-CM | POA: Diagnosis not present

## 2023-02-15 DIAGNOSIS — Z0001 Encounter for general adult medical examination with abnormal findings: Secondary | ICD-10-CM | POA: Diagnosis not present

## 2023-02-15 DIAGNOSIS — F418 Other specified anxiety disorders: Secondary | ICD-10-CM

## 2023-02-15 DIAGNOSIS — E782 Mixed hyperlipidemia: Secondary | ICD-10-CM

## 2023-02-15 MED ORDER — ROSUVASTATIN CALCIUM 10 MG PO TABS: ORAL_TABLET | ORAL | 3 refills | Status: DC

## 2023-02-15 MED ORDER — FEXOFENADINE HCL 180 MG PO TABS: 180.0000 mg | ORAL_TABLET | Freq: Every day | ORAL | 3 refills | Status: AC

## 2023-02-15 MED ORDER — VITAMIN D (ERGOCALCIFEROL) 1.25 MG (50000 UNIT) PO CAPS: 50000.0000 [IU] | ORAL_CAPSULE | ORAL | 3 refills | Status: DC

## 2023-02-15 MED ORDER — PAROXETINE HCL 40 MG PO TABS: 40.0000 mg | ORAL_TABLET | Freq: Every day | ORAL | 3 refills | Status: DC

## 2023-02-15 NOTE — Progress Notes (Signed)
Subjective:  Patient ID: Isaac [name] Alert, male    DOB: [birth year]  Age: 47 y.o. MRN: [id number]  CC: Annual Exam   HPI NAND PONTIFF presents for Annual Exam.   Precordial pain radiates to shoulder postertiorly at neck and shoulder blade. Last for a few days. Feels like he was punched. Not related to activity, position. Not debilitating.      02/15/2023    3:21 PM 02/15/2023    3:10 PM 02/07/2022    9:18 AM  Depression screen PHQ 2/9  Decreased Interest 0 0 0  Down, Depressed, Hopeless 0 0 0  PHQ - 2 Score 0 0 0  Altered sleeping 1  0  Tired, decreased energy 0  0  Change in appetite 0  0  Feeling bad or failure about yourself  0  0  Trouble concentrating 0  0  Moving slowly or fidgety/restless 0  0  Suicidal thoughts 0  0  PHQ-9 Score 1  0  Difficult doing work/chores Not difficult at all      History Coston has a past medical history of Anxiety, Depression with anxiety (02/03/2020), and Kidney stones.   He has a past surgical history that includes Wisdom tooth extraction (1995); Extracorporeal shock wave lithotripsy (Left, 04/13/2017); and Elbow surgery (Left).   His family history includes Anxiety disorder in his daughter; Asthma in his brother and daughter; Diabetes in his father, paternal grandfather, and paternal grandmother; Hearing loss in his maternal grandfather; Heart disease in his father, paternal grandfather, and paternal grandmother; Hyperlipidemia in his father, paternal grandfather, and paternal grandmother; Hypertension in his father, paternal grandfather, and paternal grandmother.He reports that he has quit smoking. He has quit using smokeless tobacco.  His smokeless tobacco use included chew. He reports that he does not drink alcohol and does not use drugs.    ROS Review of Systems  Constitutional:  Negative for activity change, fatigue, fever and unexpected weight change.  HENT:  Negative for congestion, ear pain, hearing loss, postnasal drip and trouble  swallowing.   Eyes:  Negative for pain and visual disturbance.  Respiratory:  Negative for cough, chest tightness and shortness of breath.   Cardiovascular:  Positive for chest pain (see HPI). Negative for palpitations and leg swelling.  Gastrointestinal:  Negative for abdominal distention, abdominal pain, blood in stool, constipation, diarrhea, nausea and vomiting.  Endocrine: Negative for cold intolerance, heat intolerance and polydipsia.  Genitourinary:  Negative for difficulty urinating, dysuria, flank pain, frequency and urgency.  Musculoskeletal:  Negative for arthralgias and joint swelling.  Skin:  Negative for color change, rash and wound.  Neurological:  Negative for dizziness, syncope, speech difficulty, weakness, light-headedness, numbness and headaches.  Hematological:  Does not bruise/bleed easily.  Psychiatric/Behavioral:  Negative for confusion, decreased concentration, dysphoric mood and sleep disturbance. The patient is not nervous/anxious.     Objective:  BP 131/80   Pulse 90   Temp 98 F (36.7 C)   Ht '5\' 7"'$  (1.702 m)   Wt 192 lb 6.4 oz (87.3 kg)   SpO2 96%   BMI 30.13 kg/m   BP Readings from Last 3 Encounters:  02/15/23 131/80  02/07/22 (!) 130/93  08/25/21 126/81    Wt Readings from Last 3 Encounters:  02/15/23 192 lb 6.4 oz (87.3 kg)  02/07/22 195 lb 6.4 oz (88.6 kg)  08/25/21 191 lb 9.6 oz (86.9 kg)     Physical Exam Constitutional:      Appearance: He is well-developed.  HENT:  Head: Normocephalic and atraumatic.  Eyes:     Pupils: Pupils are equal, round, and reactive to light.  Neck:     Thyroid: No thyromegaly.     Trachea: No tracheal deviation.  Cardiovascular:     Rate and Rhythm: Normal rate and regular rhythm.     Heart sounds: Normal heart sounds. No murmur heard.    No friction rub. No gallop.  Pulmonary:     Breath sounds: Normal breath sounds. No wheezing or rales.  Abdominal:     General: Bowel sounds are normal. There is  no distension.     Palpations: Abdomen is soft. There is no mass.     Tenderness: There is no abdominal tenderness.     Hernia: There is no hernia in the left inguinal area.  Genitourinary:    Penis: Normal.      Testes: Normal.  Musculoskeletal:        General: Normal range of motion.     Cervical back: Normal range of motion.  Lymphadenopathy:     Cervical: No cervical adenopathy.  Skin:    General: Skin is warm and dry.  Neurological:     Mental Status: He is alert and oriented to person, place, and time.       Assessment & Plan:   Cordel was seen today for annual exam.  Diagnoses and all orders for this visit:  Precordial pain -     EKG 12-Lead  Seasonal allergic rhinitis due to pollen -     fexofenadine (ALLEGRA) 180 MG tablet; Take 1 tablet (180 mg total) by mouth daily. For allergy symptoms  Depression with anxiety -     PARoxetine (PAXIL) 40 MG tablet; Take 1 tablet (40 mg total) by mouth daily. Needs to be seen  Mixed hyperlipidemia -     rosuvastatin (CRESTOR) 10 MG tablet; TAKE 1 TABLET BY MOUTH  DAILY FOR CHOLESTEROL ( Needs to be seen)  Other orders -     Vitamin D, Ergocalciferol, (DRISDOL) 1.25 MG (50000 UNIT) CAPS capsule; Take 1 capsule (50,000 Units total) by mouth every 7 (seven) days.       I am having Rahsaan A. Ruttan maintain his fexofenadine, PARoxetine, rosuvastatin, and Vitamin D (Ergocalciferol).  Allergies as of 02/15/2023   No Known Allergies      Medication List        Accurate as of February 15, 2023  4:30 PM. If you have any questions, ask your nurse or doctor.          fexofenadine 180 MG tablet Commonly known as: ALLEGRA Take 1 tablet (180 mg total) by mouth daily. For allergy symptoms   PARoxetine 40 MG tablet Commonly known as: PAXIL Take 1 tablet (40 mg total) by mouth daily. Needs to be seen   rosuvastatin 10 MG tablet Commonly known as: CRESTOR TAKE 1 TABLET BY MOUTH  DAILY FOR CHOLESTEROL ( Needs to be seen)    Vitamin D (Ergocalciferol) 1.25 MG (50000 UNIT) Caps capsule Commonly known as: DRISDOL Take 1 capsule (50,000 Units total) by mouth every 7 (seven) days.         Follow-up: Return in about 1 year (around 02/15/2024) for [facility] physical.  Claretta Fraise, M.D.

## 2023-02-16 ENCOUNTER — Encounter: Payer: Self-pay | Admitting: Family Medicine

## 2023-03-09 ENCOUNTER — Encounter: Payer: Self-pay | Admitting: Cardiovascular Disease

## 2023-03-09 ENCOUNTER — Ambulatory Visit: Payer: 59 | Attending: Cardiology | Admitting: Cardiovascular Disease

## 2023-03-09 DIAGNOSIS — G479 Sleep disorder, unspecified: Secondary | ICD-10-CM

## 2023-03-09 DIAGNOSIS — E782 Mixed hyperlipidemia: Secondary | ICD-10-CM

## 2023-03-09 DIAGNOSIS — R9431 Abnormal electrocardiogram [ECG] [EKG]: Secondary | ICD-10-CM | POA: Diagnosis not present

## 2023-03-09 DIAGNOSIS — Z8249 Family history of ischemic heart disease and other diseases of the circulatory system: Secondary | ICD-10-CM | POA: Diagnosis not present

## 2023-03-09 DIAGNOSIS — R0683 Snoring: Secondary | ICD-10-CM | POA: Diagnosis not present

## 2023-03-09 DIAGNOSIS — Z9189 Other specified personal risk factors, not elsewhere classified: Secondary | ICD-10-CM

## 2023-03-09 DIAGNOSIS — R0789 Other chest pain: Secondary | ICD-10-CM | POA: Diagnosis not present

## 2023-03-09 MED ORDER — ROSUVASTATIN CALCIUM 20 MG PO TABS: 20.0000 mg | ORAL_TABLET | Freq: Every day | ORAL | 3 refills | Status: DC

## 2023-03-09 MED ORDER — METOPROLOL TARTRATE 50 MG PO TABS: 100.0000 mg | ORAL_TABLET | Freq: Once | ORAL | 0 refills | Status: DC

## 2023-03-09 NOTE — Patient Instructions (Addendum)
Medication Instructions:   Increase Rosuvastatin  to 20 mg   daily   See below foe one time dose of Metoprolol tartrate   *If you need a refill on your cardiac medications before your next appointment, please call your pharmacy*   Lab Work:  BMP -today    Fasting in 2 to 3 months  LPa LIPID CMP  If you have labs (blood work) drawn today and your tests are completely normal, you will receive your results only by: [place] (if you have MyChart) OR A paper copy in the mail If you have any lab test that is abnormal or we need to change your treatment, we will call you to review the results.   Testing/Procedures: Will be schedule at [place] has requested that you have an echocardiogram. Echocardiography is a painless test that uses sound waves to create images of your heart. It provides your doctor with information about the size and shape of your heart and how well your heart's chambers and valves are working. This procedure takes approximately one hour. There are no restrictions for this procedure. Please do NOT wear cologne, perfume, aftershave, or lotions (deodorant is allowed). Please arrive 15 minutes prior to your appointment time.  And  Will be contacted on when to pick  machine and material  Your physician has recommended that you have a  home sleep study. This test records several body functions during sleep, including: brain activity, eye movement, oxygen and carbon dioxide blood levels, heart rate and rhythm, breathing rate and rhythm, the flow of air through your mouth and nose, snoring, body muscle movements, and chest and belly movement.    Follow-Up: At [facility], you and your health needs are our priority.  As part of our continuing mission to provide you with exceptional heart care, we have created designated Provider Care Teams.  These Care Teams include your primary Cardiologist (physician) and Advanced Practice  Providers (APPs -  Physician Assistants and Nurse Practitioners) who all work together to provide you with the care you need, when you need it.     Your next appointment:   3 month(s)  The format for your next appointment:   In Person  Provider:   Shelva Majestic, MD    Other Instructions   Your cardiac CT will be scheduled at  the below location:   [facility], [place] [address] [phone]   Please arrive at the [facility] and Children's Entrance (Entrance C2) of [facility] 30 minutes prior to test start time. You can use the FREE valet parking offered at entrance C (encouraged to control the heart rate for the test)  Proceed to the [facility] Radiology Department (first floor) to check-in and test prep.  All radiology patients and guests should use entrance C2 at [facility], accessed from [facility], even though the hospital's physical address listed is [address]       Please follow these instructions carefully (unless otherwise directed):  Hold all erectile dysfunction medications at least 3 days (72 hrs) prior to test. (Ie viagra, cialis, sildenafil, tadalafil, etc) We will administer nitroglycerin during this exam.    BMP - today    On the Night Before the Test: Be sure to Drink plenty of water. Do not consume any caffeinated/decaffeinated beverages or chocolate 12 hours prior to your test. Do not take any antihistamines 12 hours prior to  your test.   On the Day of the Test: Drink plenty of water until 1 hour prior to the test. Do not eat any food 1 hour prior to test. You may take your regular medications prior to the test.  Take metoprolol (Lopressor) 100 mg  two hours prior to test.       After the Test: Drink plenty of water. After receiving IV contrast, you may experience a mild flushed feeling. This is normal. On occasion, you may experience a mild rash up to 24 hours  after the test. This is not dangerous. If this occurs, you can take Benadryl 25 mg and increase your fluid intake. If you experience trouble breathing, this can be serious. If it is severe call 911 IMMEDIATELY. If it is mild, please call our office.   We will call to schedule your test 2-4 weeks out understanding that some insurance companies will need an authorization prior to the service being performed.   For non-scheduling related questions, please contact the cardiac imaging nurse navigator should you have any questions/concerns: Marchia Bond, Cardiac Imaging Nurse Navigator Gordy Clement, Cardiac Imaging Nurse Navigator [facility] Direct Office Dial: [phone]   For scheduling needs, including cancellations and rescheduling, please call [place], [phone].

## 2023-03-09 NOTE — Progress Notes (Signed)
Cardiology Office Note    Date:  03/15/2023   ID:  [name], DOB 1976/02/14, MRN [id number]  PCP:  Isaac Fraise, MD  Cardiologist:  Isaac Majestic, MD   New cardiology evaluation referred by [name] for evaluation of chest pain  Chief Complaint  Patient presents with   New Patient (Initial Visit)   Chest Pain    History of Present Illness:  Isaac [name] is a 47 y.o. male who had recently been evaluated by [name] with precordial chest pain radiating to his shoulder and posteriorly to his neck and shoulder blade.  Remotely, in 2018, he was evaluated by [name] with atypical chest pain.  His job was stressful as a Economist and at that time, it was felt that his chest pain was atypical in nature, not associated with activity and no cardiovascular workup was undertaken.  Presently, Isaac [name] admits that he experiences chest pain off and on.  He does not describe it as a chest tightness but feels a "punching sensation.  It occurs on the left side of his chest and is nonexertional and can get transmitted to the left shoulder and trapezius muscle.  He has a history of mild hyperlipidemia and apparently has been on low-dose rosuvastatin 10 mg for the last 2 years.  He had undergone laboratory on February 08, 2023 which showed total cholesterol 188, LDL 114, triglycerides 188, and HDL 41.  Creatinine was 1.17.  Hemoglobin 14.9.  ALT was normal at 26.  Upon further questioning, Isaac [name] states that his sleep is nonrestorative.  He has been told of having very loud snoring.  He does experience occasional nocturia as well as some daytime sleepiness.  He has never been evaluated for sleep apnea.  He presents for cardiology evaluation.   Past Medical History:  Diagnosis Date   Anxiety    Depression with anxiety 02/03/2020   Kidney stones     Past Surgical History:  Procedure Laterality Date   ELBOW SURGERY Left    nerve release     EXTRACORPOREAL SHOCK WAVE LITHOTRIPSY Left 04/13/2017   Procedure: LEFT EXTRACORPOREAL SHOCK WAVE LITHOTRIPSY (ESWL);  Surgeon: Isaac Clines, MD;  Location: [facility];  Service: Urology;  Laterality: Left;   WISDOM TOOTH EXTRACTION  1995    Current Medications: Outpatient Medications Prior to Visit  Medication Sig Dispense Refill   fexofenadine (ALLEGRA) 180 MG tablet Take 1 tablet (180 mg total) by mouth daily. For allergy symptoms 90 tablet 3   PARoxetine (PAXIL) 40 MG tablet Take 1 tablet (40 mg total) by mouth daily. Needs to be seen 90 tablet 3   Vitamin D, Ergocalciferol, (DRISDOL) 1.25 MG (50000 UNIT) CAPS capsule Take 1 capsule (50,000 Units total) by mouth every 7 (seven) days. 13 capsule 3   rosuvastatin (CRESTOR) 10 MG tablet TAKE 1 TABLET BY MOUTH  DAILY FOR CHOLESTEROL ( Needs to be seen) 90 tablet 3   No facility-administered medications prior to visit.     Allergies:   Patient has no known allergies.   Social History   Socioeconomic History   Marital status: Married    Spouse name: Not on file   Number of children: 3   Years of education: 14   Highest education level: Not on file  Occupational History   Occupation: computer crimes Radio producer    Employer: [employer]  Tobacco Use   Smoking status: Former   Smokeless tobacco: Former  Types: Chew  Vaping Use   Vaping Use: Never used  Substance and Sexual Activity   Alcohol use: No   Drug use: No   Sexual activity: Yes    Partners: Female  Other Topics Concern   Not on file  Social History Narrative   Lives with wife and 3 children in a one story home.  Works as a Water engineer.  Education: some college.    Social Determinants of Health   Financial Resource Strain: Not on file  Food Insecurity: Not on file  Transportation Needs: Not on file  Physical Activity: Not on file  Stress: Not on file  Social Connections: Not on file    Social history is notable that he was born in  [place].  He lives in [place].  He is married for 25 years.  He has 3 children.  He is employed by [employer] and works as a Tax adviser.  He completed 12 grade.  There is remote tobacco history of 11 years with quit date 2007.  He drinks occasional whiskey.  He does not routinely exercise but admits to being active.  Family History:  The patient's family history includes Anxiety disorder in his daughter; Asthma in his brother and daughter; Diabetes in his father, paternal grandfather, and paternal grandmother; Hearing loss in his maternal grandfather; Heart disease in his father, paternal grandfather, and paternal grandmother; Hyperlipidemia in his father, paternal grandfather, and paternal grandmother; Hypertension in his father, paternal grandfather, and paternal grandmother.   Mother is living at age 17 and has multiple sclerosis.  Father is living at age 88 and is status post CABG revascularization surgery and is diabetic and has heart failure.  His children are 33 and 20 who are daughters and he has a 47 year old son.  ROS General: Negative; No fevers, chills, or night sweats;  HEENT: Negative; No changes in vision or hearing, sinus congestion, difficulty swallowing Pulmonary: Negative; No cough, wheezing, shortness of breath, hemoptysis Cardiovascular: See HPI GI: Negative; No nausea, vomiting, diarrhea, or abdominal pain GU: Negative; No dysuria, hematuria, or difficulty voiding Musculoskeletal: Negative; no myalgias, joint pain, or weakness Hematologic/Oncology: Negative; no easy bruising, bleeding Endocrine: Negative; no heat/cold intolerance; no diabetes Neuro: Negative; no changes in balance, headaches Skin: Negative; No rashes or skin lesions Psychiatric: Negative; No behavioral problems, depression Sleep: Negative; No snoring, daytime sleepiness, hypersomnolence, bruxism, restless legs, hypnogognic hallucinations, no cataplexy Other comprehensive 14 point system  review is negative.   PHYSICAL EXAM:   VS:  BP 118/88 (BP Location: Left Arm, Patient Position: Sitting, Cuff Size: Normal)   Pulse 78   Ht 5\' 8"  (1.727 m)   Wt 196 lb (88.9 kg)   BMI 29.80 kg/m     Repeat blood pressure by me was 120/84  Wt Readings from Last 3 Encounters:  03/09/23 196 lb (88.9 kg)  02/15/23 192 lb 6.4 oz (87.3 kg)  02/07/22 195 lb 6.4 oz (88.6 kg)    General: Alert, oriented, no distress.  Skin: normal turgor, no rashes, warm and dry HEENT: Normocephalic, atraumatic. Pupils equal round and reactive to light; sclera anicteric; extraocular muscles intact;  Nose without nasal septal hypertrophy Mouth/Parynx benign; Mallinpatti scale 3 Neck: No JVD, no carotid bruits; normal carotid upstroke Lungs: clear to ausculatation and percussion; no wheezing or rales Chest wall: without tenderness to palpitation Heart: PMI not displaced, RRR, s1 s2 normal, 1/6 systolic murmur, no diastolic murmur, no rubs, gallops, thrills, or heaves Abdomen: soft, nontender; no hepatosplenomehaly,  BS+; abdominal aorta nontender and not dilated by palpation. Back: no CVA tenderness Pulses 2+ Musculoskeletal: full range of motion, normal strength, no joint deformities Extremities: no clubbing cyanosis or edema, Homan's sign negative  Neurologic: grossly nonfocal; Cranial nerves grossly wnl Psychologic: Normal mood and affect   Studies/Labs Reviewed:   March 09, 2023 ECG (independently read by me): NSR at 78, no ectopy, normal intervals  I personally reviewed the ECG from February 15, 2023 (independently read by me): Normal sinus rhythm at 85 bpm.  Nonspecific T wave abnormality in lead aVL.  Recent Labs:    Latest Ref Rng & Units 03/09/2023   12:26 PM 02/08/2023    8:35 AM 02/07/2022   10:00 AM  BMP  Glucose 70 - 99 mg/dL 84  99  97   BUN 6 - 24 mg/dL 14  9  13    Creatinine 0.76 - 1.27 mg/dL 1.06  1.17  1.12   BUN/Creat Ratio 9 - 20 13  8  12    Sodium 134 - 144 mmol/L 139  138  140    Potassium 3.5 - 5.2 mmol/L 4.5  4.5  4.7   Chloride 96 - 106 mmol/L 100  104  104   CO2 20 - 29 mmol/L 23  21  25    Calcium 8.7 - 10.2 mg/dL 9.5  8.6  9.2         Latest Ref Rng & Units 02/08/2023    8:35 AM 02/07/2022   10:00 AM 02/01/2021    8:29 AM  Hepatic Function  Total Protein 6.0 - 8.5 g/dL 7.2  6.9  6.7   Albumin 4.1 - 5.1 g/dL 4.6  4.5  4.3   AST 0 - 40 IU/L 21  18  19    ALT 0 - 44 IU/L 26  17  22    Alk Phosphatase 44 - 121 IU/L 90  92  79   Total Bilirubin 0.0 - 1.2 mg/dL 0.5  0.5  0.5        Latest Ref Rng & Units 02/08/2023    8:35 AM 02/07/2022   10:00 AM 02/01/2021    8:29 AM  CBC  WBC 3.4 - 10.8 x10E3/uL 5.0  4.6  5.5   Hemoglobin 13.0 - 17.7 g/dL 15.1  14.9  15.1   Hematocrit 37.5 - 51.0 % 45.3  45.6  44.8   Platelets 150 - 450 x10E3/uL 265  249  239    Lab Results  Component Value Date   MCV 90 02/08/2023   MCV 89 02/07/2022   MCV 90 02/01/2021   Lab Results  Component Value Date   TSH 0.992 01/20/2018   Lab Results  Component Value Date   HGBA1C 5.7 (H) 02/08/2023     BNP No results found for: "BNP"  ProBNP No results found for: "PROBNP"   Lipid Panel     Component Value Date/Time   CHOL 188 02/08/2023 0835   TRIG 188 (H) 02/08/2023 0835   HDL 41 02/08/2023 0835   CHOLHDL 4.6 02/08/2023 0835   LDLCALC 114 (H) 02/08/2023 0835   LABVLDL 33 02/08/2023 0835     RADIOLOGY: No results found.   Additional studies/ records that were reviewed today include:   I reviewed the records from his 2018 evaluation with [name].  I reviewed the records of Dr. Claretta [name] at [facility] family medicine.   ASSESSMENT:    1. Chest discomfort   2. Abnormal electrocardiogram   3. Family history of  heart disease   4. Mixed hyperlipidemia   5. Snoring   6. Sleeping difficulties   7. 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  In 2018 he had experienced  some atypical chest pain which was not felt to be ischemic in etiology.  He has a history of mild hyperlipidemia and has been on rosuvastatin at low-dose 10 mg for 2 years.  Recently, he has experienced intermittent episodes of a punching sensation in his check which seems to be located on the left side of his chest radiating to his shoulder and also experiences discomfort in the left trapezius muscle.  He denies any clear-cut exertional precipitation.  His chest pain has atypical features and raises concern for probable musculoskeletal in etiology.  Presently, I am recommended he undergo a 2D echo Doppler study for assessment of LV systolic and diastolic function and valvular architecture.  I am scheduling him to undergo coronary CTA evaluation with his chest pain and particularly with his family history for CAD with his father under going CABG revascularization surgery and has history of hyperlipidemia.  His ECG is unremarkable.  Upon further questioning, he has symptoms suggestive of possible underlying sleep apnea with loud snoring, frequent awakenings, nonrestorative sleep, daytime sleepiness, and several episodes of nocturia per night.  I have recommended evaluation with a home sleep study for initial assessment of possible sleep apnea.  I have recommended further titration of his rosuvastatin from 10 mg up to 20 mg with his most recent LDL cholesterol at 114 on February 08, 2023.  In 2 months, I have recommended that he have follow-up laboratory with a comprehensive metabolic panel, fasting lipid studies, and LP(a).  I will contact him regarding the results of the above studies and plan to see him in 2 to 3 months for follow-up evaluation.  Medication Adjustments/Labs and Tests Ordered: Current medicines are reviewed at length with the patient today.  Concerns regarding medicines are outlined above.  Medication changes, Labs and Tests ordered today are listed in the Patient Instructions below. Patient  Instructions  Medication Instructions:   Increase Rosuvastatin  to 20 mg   daily   See below foe one time dose of Metoprolol tartrate   *If you need a refill on your cardiac medications before your next appointment, please call your pharmacy*   Lab Work:  BMP -today    Fasting in 2 to 3 months  LPa LIPID CMP  If you have labs (blood work) drawn today and your tests are completely normal, you will receive your results only by: [place] (if you have MyChart) OR A paper copy in the mail If you have any lab test that is abnormal or we need to change your treatment, we will call you to review the results.   Testing/Procedures: Will be schedule at [place] has requested that you have an echocardiogram. Echocardiography is a painless test that uses sound waves to create images of your heart. It provides your doctor with information about the size and shape of your heart and how well your heart's chambers and valves are working. This procedure takes approximately one hour. There are no restrictions for this procedure. Please do NOT wear cologne, perfume, aftershave, or lotions (deodorant is allowed). Please arrive 15 minutes prior to your appointment time.  And  Will be contacted on when to pick  machine and material  Your physician has recommended that you have a  home sleep study. This test records several body functions during sleep, including: brain activity, eye movement, oxygen and carbon dioxide blood levels, heart rate and rhythm, breathing rate and rhythm, the flow of air through your mouth and nose, snoring, body muscle movements, and chest and belly movement.    Follow-Up: At [facility], you and your health needs are our priority.  As part of our continuing mission to provide you with exceptional heart care, we have created designated Provider Care Teams.  These Care Teams include your primary Cardiologist (physician) and  Advanced Practice Providers (APPs -  Physician Assistants and Nurse Practitioners) who all work together to provide you with the care you need, when you need it.     Your next appointment:   3 month(s)  The format for your next appointment:   In Person  Provider:   Shelva Majestic, MD    Other Instructions   Your cardiac CT will be scheduled at  the below location:   [facility] [place],  [address] [phone]   Please arrive at the [facility] and Children's Entrance (Entrance C2) of [facility] 30 minutes prior to test start time. You can use the FREE valet parking offered at entrance C (encouraged to control the heart rate for the test)  Proceed to the [facility] Radiology Department (first floor) to check-in and test prep.  All radiology patients and guests should use entrance C2 at [facility], accessed from [facility], even though the hospital's physical address listed is [address]       Please follow these instructions carefully (unless otherwise directed):  Hold all erectile dysfunction medications at least 3 days (72 hrs) prior to test. (Ie viagra, cialis, sildenafil, tadalafil, etc) We will administer nitroglycerin during this exam.    BMP - today    On the Night Before the Test: Be sure to Drink plenty of water. Do not consume any caffeinated/decaffeinated beverages or chocolate 12 hours prior to your test. Do not take any antihistamines 12 hours prior to your test.   On the Day of the Test: Drink plenty of water until 1 hour prior to the test. Do not eat any food 1 hour prior to test. You may take your regular medications prior to the test.  Take metoprolol (Lopressor) 100 mg  two hours prior to test.       After the Test: Drink plenty of water. After receiving IV contrast, you may experience a mild flushed feeling. This is normal. On occasion, you may experience a mild rash  up to 24 hours after the test. This is not dangerous. If this occurs, you can take Benadryl 25 mg and increase your fluid intake. If you experience trouble breathing, this can be serious. If it is severe call 911 IMMEDIATELY. If it is mild, please call our office.   We will call to schedule your test 2-4 weeks out understanding that some insurance companies will need an authorization prior to the service being performed.   For non-scheduling related questions, please contact the cardiac imaging nurse navigator should you have any questions/concerns: Marchia Bond, Cardiac Imaging Nurse Navigator Gordy Clement, Cardiac Imaging Nurse Navigator [place] [place] [facility] Direct Office Dial: [phone]   For scheduling needs, including cancellations and rescheduling, please call [place], [phone].    Signed, Isaac Majestic, MD  03/15/2023 11:22 AM  [facility] [address] [place], [place], [place]  [address] Phone: [phone]

## 2023-03-10 LAB — BASIC METABOLIC PANEL
BUN/Creatinine Ratio: 13 (ref 9–20)
BUN: 14 mg/dL (ref 6–24)
CO2: 23 mmol/L (ref 20–29)
Calcium: 9.5 mg/dL (ref 8.7–10.2)
Chloride: 100 mmol/L (ref 96–106)
Creatinine, Ser: 1.06 mg/dL (ref 0.76–1.27)
Glucose: 84 mg/dL (ref 70–99)
Potassium: 4.5 mmol/L (ref 3.5–5.2)
Sodium: 139 mmol/L (ref 134–144)
eGFR: 88 mL/min/{1.73_m2} (ref >59)

## 2023-03-15 ENCOUNTER — Encounter: Payer: Self-pay | Admitting: Cardiovascular Disease

## 2023-03-15 ENCOUNTER — Telehealth (HOSPITAL_COMMUNITY): Payer: Self-pay | Admitting: Emergency Medicine

## 2023-03-15 NOTE — Telephone Encounter (Signed)
Reaching out to patient to offer assistance regarding upcoming cardiac imaging study; pt verbalizes understanding of appt date/time, parking situation and where to check in, pre-test NPO status and medications ordered, and verified current allergies; name and call back number provided for further questions should they arise Teddy Pena RN Navigator Cardiac Imaging [facility] [phone] office [phone] cell 

## 2023-03-16 ENCOUNTER — Ambulatory Visit (HOSPITAL_COMMUNITY)
Admission: RE | Admit: 2023-03-16 | Discharge: 2023-03-16 | Disposition: A | Discharge status: Home / Self Care | Payer: 59 | Source: Ambulatory Visit | Attending: Cardiovascular Disease | Admitting: Cardiovascular Disease

## 2023-03-16 DIAGNOSIS — R9431 Abnormal electrocardiogram [ECG] [EKG]: Secondary | ICD-10-CM | POA: Diagnosis present

## 2023-03-16 MED ORDER — NITROGLYCERIN 0.4 MG SL SUBL
SUBLINGUAL_TABLET | SUBLINGUAL | Status: AC
  Filled 2023-03-16: qty 2

## 2023-03-16 MED ORDER — NITROGLYCERIN 0.4 MG SL SUBL
0.8000 mg | SUBLINGUAL_TABLET | SUBLINGUAL | Status: DC | PRN
  Administered 2023-03-16: 0.8 mg via SUBLINGUAL

## 2023-03-16 MED ORDER — IOHEXOL 350 MG/ML SOLN
100.0000 mL | Freq: Once | INTRAVENOUS | Status: AC | PRN
  Administered 2023-03-16: 100 mL via INTRAVENOUS

## 2023-03-29 ENCOUNTER — Ambulatory Visit (HOSPITAL_BASED_OUTPATIENT_CLINIC_OR_DEPARTMENT_OTHER): Payer: 59 | Attending: Cardiovascular Disease | Admitting: Cardiovascular Disease

## 2023-03-29 VITALS — Ht 67.0 in | Wt 196.0 lb

## 2023-03-29 DIAGNOSIS — R0683 Snoring: Secondary | ICD-10-CM | POA: Diagnosis not present

## 2023-03-29 DIAGNOSIS — G479 Sleep disorder, unspecified: Secondary | ICD-10-CM | POA: Diagnosis not present

## 2023-03-29 DIAGNOSIS — R9431 Abnormal electrocardiogram [ECG] [EKG]: Secondary | ICD-10-CM | POA: Diagnosis present

## 2023-03-29 DIAGNOSIS — Z9189 Other specified personal risk factors, not elsewhere classified: Secondary | ICD-10-CM | POA: Diagnosis not present

## 2023-03-29 DIAGNOSIS — G4733 Obstructive sleep apnea (adult) (pediatric): Secondary | ICD-10-CM

## 2023-04-03 ENCOUNTER — Encounter: Payer: Self-pay | Admitting: Cardiovascular Disease

## 2023-04-06 ENCOUNTER — Ambulatory Visit (HOSPITAL_COMMUNITY): Payer: 59

## 2023-04-07 ENCOUNTER — Ambulatory Visit: Payer: 59 | Admitting: Cardiology

## 2023-04-10 ENCOUNTER — Encounter (HOSPITAL_BASED_OUTPATIENT_CLINIC_OR_DEPARTMENT_OTHER): Payer: Self-pay | Admitting: Cardiovascular Disease

## 2023-04-10 NOTE — Procedures (Signed)
      Patient Name: Isaac [name], Isaac [name] Date: 03/29/2023 Gender: Male D.O.B: Nov 24, 1976 Age (years): 53 Referring Provider: Nicki Guadalajara MD, ABSM Height (inches): 68 Interpreting Physician: Nicki Guadalajara MD, ABSM Weight (lbs): 196 RPSGT: [place] BMI: 30 MRN: [id number] Neck Size: 16.50  CLINICAL INFORMATION Sleep Study Type: HST  Indication for sleep study: snoring, daytime sleepiness, non-restorative sleep  Epworth Sleepiness Score: 18  SLEEP STUDY TECHNIQUE A multi-channel overnight portable sleep study was performed. The channels recorded were: nasal airflow, thoracic respiratory movement, and oxygen saturation with a pulse oximetry. Snoring was also monitored.  MEDICATIONS fexofenadine (ALLEGRA) 180 MG tablet metoprolol tartrate (LOPRESSOR) 50 MG tablet (Expired) PARoxetine (PAXIL) 40 MG tablet rosuvastatin (CRESTOR) 20 MG tablet Vitamin D, Ergocalciferol, (DRISDOL) 1.25 MG (50000 UNIT) CAPS capsule Patient self administered medications include: N/A.  SLEEP ARCHITECTURE Patient was studied for 364.9 minutes. The sleep efficiency was 100.0 % and the patient was supine for 0%. The arousal index was 0.0 per hour.  RESPIRATORY PARAMETERS The overall AHI was 30.1 per hour, with a central apnea index of 0 per hour. There is a significant positional component with supine sleep AHI 30.6/h and non-supine sleep AHI 12.5/h  The oxygen nadir was 83% during sleep.  CARDIAC DATA Mean heart rate during sleep was 77.8 bpm. Heart rate range: 58 - 104 bpm.  IMPRESSIONS - Severe obstructive sleep apnea occurred during this study (AHI 30.1/h). - Moderate oxygen desaturation to a nadir of 83%. - Patient snored for 129.6 minutes (35.5%) during the sleep.  DIAGNOSIS - Obstructive Sleep Apnea (G47.33)  RECOMMENDATIONS - Recommend CPAP therapy for treatment of his severe sleep disordered breathing. If unable to obtain an in-lab titration, initiate a trial of Auto-PAP  with EPR of 3 at 6 - 18 cm of water. - Effort should be made to optimize nasal and oropharyngeal patency. - Positional therapy avoiding supine position during sleep. - Avoid alcohol, sedatives and other CNS depressants that may worsen sleep apnea and disrupt normal sleep architecture. - Sleep hygiene should be reviewed to assess factors that may improve sleep quality. - Weight management and regular exercise should be initiated or continued. - Recommend a download and sleep clinic evaluation after 4 - 6 weeks of therapy.    [Electronically signed] 04/10/2023 05:50 PM  Nicki Guadalajara MD, [facility], ABSM Diplomate, American Board of Sleep Medicine  NPI: [id number]  [facility] PH: [phone]   FX: [phone] ACCREDITED BY THE AMERICAN ACADEMY OF SLEEP MEDICINE

## 2023-04-11 ENCOUNTER — Telehealth: Payer: Self-pay | Admitting: Cardiovascular Disease

## 2023-04-11 NOTE — Telephone Encounter (Signed)
Patient had sent same message last week, while doctor out on vacation Please advise

## 2023-04-11 NOTE — Telephone Encounter (Signed)
Patient calling to see if the echo is still need since is CT was good. Please advise

## 2023-04-18 NOTE — Telephone Encounter (Signed)
Spoke with patient and advised that Dr Tresa Endo does want the test.  He states understanding.

## 2023-04-18 NOTE — Telephone Encounter (Signed)
Patient called to check on status of his call last week.  He would like to know if he still needs to have echo done that is scheduled for this coming Friday, since his CT came back fine.  Please advise.

## 2023-04-18 NOTE — Telephone Encounter (Signed)
I spoke with Dr.Kelly and he advised that he should have his ECHO completed. This is a different test that looks at heart function, we messaged him via mychart too.   Sorry for the multiple messages, thought this was handled. Thanks!

## 2023-04-21 ENCOUNTER — Ambulatory Visit (HOSPITAL_COMMUNITY): Payer: 59 | Attending: Cardiovascular Disease

## 2023-04-21 DIAGNOSIS — R9431 Abnormal electrocardiogram [ECG] [EKG]: Secondary | ICD-10-CM | POA: Diagnosis present

## 2023-04-21 DIAGNOSIS — R079 Chest pain, unspecified: Secondary | ICD-10-CM | POA: Diagnosis not present

## 2023-04-21 DIAGNOSIS — Z9189 Other specified personal risk factors, not elsewhere classified: Secondary | ICD-10-CM

## 2023-04-21 LAB — COMPREHENSIVE METABOLIC PANEL
ALT: 44 IU/L (ref 0–44)
AST: 29 IU/L (ref 0–40)
Albumin/Globulin Ratio: 1.7 (ref 1.2–2.2)
Albumin: 4.3 g/dL (ref 4.1–5.1)
Alkaline Phosphatase: 88 IU/L (ref 44–121)
BUN/Creatinine Ratio: 15 (ref 9–20)
BUN: 16 mg/dL (ref 6–24)
Bilirubin Total: 0.5 mg/dL (ref 0.0–1.2)
CO2: 25 mmol/L (ref 20–29)
Calcium: 9.5 mg/dL (ref 8.7–10.2)
Chloride: 102 mmol/L (ref 96–106)
Creatinine, Ser: 1.06 mg/dL (ref 0.76–1.27)
Globulin, Total: 2.6 g/dL (ref 1.5–4.5)
Glucose: 96 mg/dL (ref 70–99)
Potassium: 4.4 mmol/L (ref 3.5–5.2)
Sodium: 139 mmol/L (ref 134–144)
Total Protein: 6.9 g/dL (ref 6.0–8.5)
eGFR: 88 mL/min/{1.73_m2} (ref >59)

## 2023-04-21 LAB — ECHOCARDIOGRAM COMPLETE
Area-P 1/2: 4.4 cm2
S' Lateral: 2.5 cm

## 2023-04-25 LAB — LIPID PANEL
Chol/HDL Ratio: 4.1 ratio (ref 0.0–5.0)
Cholesterol, Total: 143 mg/dL (ref 100–199)
HDL: 35 mg/dL — ABNORMAL LOW (ref >39)
LDL Chol Calc (NIH): 78 mg/dL (ref 0–99)
Triglycerides: 175 mg/dL — ABNORMAL HIGH (ref 0–149)
VLDL Cholesterol Cal: 30 mg/dL (ref 5–40)

## 2023-04-25 LAB — LIPOPROTEIN A (LPA): Lipoprotein (a): 14.1 nmol/L (ref <75.0)

## 2023-04-27 ENCOUNTER — Telehealth: Payer: Self-pay | Admitting: *Deleted

## 2023-04-27 DIAGNOSIS — R0683 Snoring: Secondary | ICD-10-CM

## 2023-04-27 DIAGNOSIS — G4733 Obstructive sleep apnea (adult) (pediatric): Secondary | ICD-10-CM

## 2023-04-27 NOTE — Telephone Encounter (Signed)
-----   Message from Patricia Pesa, RN sent at 04/21/2023  1:29 PM EDT ----- Regarding: Severe Sleep Apnea I am routing to both of you due to severe sleep apnea.  ----- Message ----- From: Lennette Bihari, MD Sent: 04/10/2023   5:54 PM EDT To: Gaynelle Cage, CMA; Mechele Claude, MD  Burna Mortimer please notify patient of the results.  He has severe sleep apnea.  If unable to get an in lab CPAP titration study, can initiate AutoPap as prescribed.

## 2023-04-27 NOTE — Telephone Encounter (Signed)
The patient has been notified of the result and verbalized understanding.  All questions (if any) were answered. Latrelle Dodrill, CMA 04/27/2023 6:29 PM    Upon patient request DME selection is [facility]. Patient understands he will be contacted by [facility] to set up his cpap. Patient understands to call if [facility] does not contact him with new setup in a timely manner. Patient understands they will be called once confirmation has been received from [facility]/ that they have received their new machine to schedule 10 week follow up appointment.   [facility] notified of new cpap order  Please add to airview Patient was grateful for the call and thanked me.

## 2023-04-28 ENCOUNTER — Other Ambulatory Visit: Payer: Self-pay | Admitting: Family Medicine

## 2023-04-28 ENCOUNTER — Ambulatory Visit: Payer: 59 | Attending: Cardiovascular Disease | Admitting: Cardiovascular Disease

## 2023-04-28 ENCOUNTER — Encounter: Payer: Self-pay | Admitting: Cardiovascular Disease

## 2023-04-28 DIAGNOSIS — J301 Allergic rhinitis due to pollen: Secondary | ICD-10-CM

## 2023-04-28 DIAGNOSIS — G4733 Obstructive sleep apnea (adult) (pediatric): Secondary | ICD-10-CM | POA: Diagnosis not present

## 2023-04-28 DIAGNOSIS — R0789 Other chest pain: Secondary | ICD-10-CM | POA: Diagnosis not present

## 2023-04-28 DIAGNOSIS — E782 Mixed hyperlipidemia: Secondary | ICD-10-CM

## 2023-04-28 DIAGNOSIS — Z8249 Family history of ischemic heart disease and other diseases of the circulatory system: Secondary | ICD-10-CM

## 2023-04-28 MED ORDER — METOPROLOL SUCCINATE ER 25 MG PO TB24: 25.0000 mg | ORAL_TABLET | Freq: Every day | ORAL | 3 refills | Status: DC

## 2023-04-28 NOTE — Progress Notes (Signed)
Cardiology Office Note    Date:  05/05/2023   ID:  Isaac [name], DOB [birth year], MRN [id number]  PCP:  Mechele Claude, MD  Cardiologist:  Nicki Guadalajara, MD   2 month F/U cardiology evaluation referred by Dr. Darlyn Read for evaluation of chest pain   History of Present Illness:  Isaac [name] is a 47 y.o. male who had recently been evaluated by Dr. Darlyn Read at [facility] with precordial chest pain radiating to his shoulder and posteriorly to his neck and shoulder blade.  Remotely, in 2018, he was evaluated by Dr. Elberta Fortis with atypical chest pain.  His job was stressful as a Futures trader and at that time, it was felt that his chest pain was atypical in nature, not associated with activity and no cardiovascular workup was undertaken.  I saw him for my initial evaluation on March 09, 2023.  He presents for 38-month follow-up evaluation.  Presently, Isaac [name] admits that he experiences chest pain off and on.  He does not describe it as a chest tightness but feels a "punching sensation.  It occurs on the left side of his chest and is nonexertional and can get transmitted to the left shoulder and trapezius muscle.  He has a history of mild hyperlipidemia and apparently has been on low-dose rosuvastatin 10 mg for the last 2 years.  He had undergone laboratory on February 08, 2023 which showed total cholesterol 188, LDL 114, triglycerides 188, and HDL 41.  Creatinine was 1.17.  Hemoglobin 14.9.  ALT was normal at 26.  Upon further questioning, Isaac [name] states that his sleep is nonrestorative.  He has been told of having very loud snoring.  He does experience occasional nocturia as well as some daytime sleepiness.  He has never been evaluated for sleep apnea.    During my initial evaluation, I recommended that he undergo a 2D echo Doppler study for assessment of LV systolic and diastolic and valvular architecture.  I scheduled him to undergo coronary CTA evaluation with his chest pain  and particularly with his family history for CAD.  Due to concerns for obstructive sleep apnea I recommended he undergo a sleep evaluation.  I recommended follow-up laboratory in several months.  Coronary CTA was done on March 16, 2023.  Calcium score was 7.02 representing 78th percentile.  Minimal plaque was noted in the proximal LAD at 1 to 24%.  Chest CT over read did not reveal any significant extracardiac findings.  On Apr 21, 2023, an echo Doppler study showed normal LV function with EF 65 to 70% with normal diastolic parameters.  Valves were essentially normal.  He underwent a home sleep study on March 29, 2023.  He was found to have severe sleep apnea with an AHI of 30.1/h.  He has a significant positional component with supine sleep AHI at 30.6 and nonsupine sleep at 12.5.  CPAP titration or is AutoPap therapy was recommended.  He has not yet been set up with CPAP therapy.  Presently, he denies any chest pain.  He states most of the time his heart rate is in the 90s.  He presents for follow-up evaluation.  Past Medical History:  Diagnosis Date   Anxiety    Depression with anxiety 02/03/2020   Kidney stones     Past Surgical History:  Procedure Laterality Date   ELBOW SURGERY Left    nerve release    EXTRACORPOREAL SHOCK WAVE LITHOTRIPSY Left 04/13/2017   Procedure: LEFT EXTRACORPOREAL SHOCK WAVE  LITHOTRIPSY (ESWL);  Surgeon: Jethro Bolus, MD;  Location: [facility];  Service: Urology;  Laterality: Left;   WISDOM TOOTH EXTRACTION  1995    Current Medications: Outpatient Medications Prior to Visit  Medication Sig Dispense Refill   PARoxetine (PAXIL) 40 MG tablet Take 1 tablet (40 mg total) by mouth daily. Needs to be seen 90 tablet 3   rosuvastatin (CRESTOR) 20 MG tablet Take 1 tablet (20 mg total) by mouth daily. 90 tablet 3   Vitamin D, Ergocalciferol, (DRISDOL) 1.25 MG (50000 UNIT) CAPS capsule Take 1 capsule (50,000 Units total) by mouth every 7 (seven) days. 13 capsule 3    fexofenadine (ALLEGRA) 180 MG tablet Take 1 tablet (180 mg total) by mouth daily. For allergy symptoms (Patient not taking: Reported on 04/28/2023) 90 tablet 3   metoprolol tartrate (LOPRESSOR) 50 MG tablet Take 2 tablets (100 mg total) by mouth once for 1 dose. TAKE TWO HOURS PRIOR TO  SCHEDULE CARDIAC TEST 2 tablet 0   No facility-administered medications prior to visit.     Allergies:   Patient has no known allergies.   Social History   Socioeconomic History   Marital status: Married    Spouse name: Not on file   Number of children: 3   Years of education: 14   Highest education level: Not on file  Occupational History   Occupation: computer crimes International aid/development worker    Employer: [employer]  Tobacco Use   Smoking status: Former   Smokeless tobacco: Former    Types: Associate Professor Use: Never used  Substance and Sexual Activity   Alcohol use: No   Drug use: No   Sexual activity: Yes    Partners: Female  Other Topics Concern   Not on file  Social History Narrative   Lives with wife and 3 children in a one story home.  Works as a Investment banker, corporate.  Education: some college.    Social Determinants of Health   Financial Resource Strain: Not on file  Food Insecurity: Not on file  Transportation Needs: Not on file  Physical Activity: Not on file  Stress: Not on file  Social Connections: Not on file    Social history is notable that he was born in [place].  He lives in [place].  He is married for 25 years.  He has 3 children.  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  He completed 12 grade.  There is remote tobacco history of 11 years with quit date 2007.  He drinks occasional whiskey.  He does not routinely exercise but admits to being active.  Family History:  The patient's family history includes Anxiety disorder in his daughter; Asthma in his brother and daughter; Diabetes in his father, paternal grandfather, and  paternal grandmother; Hearing loss in his maternal grandfather; Heart disease in his father, paternal grandfather, and paternal grandmother; Hyperlipidemia in his father, paternal grandfather, and paternal grandmother; Hypertension in his father, paternal grandfather, and paternal grandmother.   Mother is living at age 71 and has multiple sclerosis.  Father is living at age 2 and is status post CABG revascularization surgery and is diabetic and has heart failure.  His children are 38 and 20 who are daughters and he has a 17 year old son.  ROS General: Negative; No fevers, chills, or night sweats;  HEENT: Negative; No changes in vision or hearing, sinus congestion, difficulty swallowing Pulmonary: Negative; No cough, wheezing, shortness of  breath, hemoptysis Cardiovascular: See HPI GI: Negative; No nausea, vomiting, diarrhea, or abdominal pain GU: Negative; No dysuria, hematuria, or difficulty voiding Musculoskeletal: Negative; no myalgias, joint pain, or weakness Hematologic/Oncology: Negative; no easy bruising, bleeding Endocrine: Negative; no heat/cold intolerance; no diabetes Neuro: Negative; no changes in balance, headaches Skin: Negative; No rashes or skin lesions Psychiatric: Negative; No behavioral problems, depression Sleep: Positive for severe sleep apnea on home sleep study.  Positive snoring, daytime sleepiness, nonrestorative sleep;  no restless legs, hypnogognic hallucinations, no cataplexy  Other comprehensive 14 point system review is negative.   PHYSICAL EXAM:   VS:  BP (!) 138/98 (Patient Position: Sitting, Cuff Size: Normal)   Pulse 95   Ht 5\' 8"  (1.727 m)   Wt 198 lb 3.2 oz (89.9 kg)   SpO2 95%   BMI 30.14 kg/m     Repeat blood pressure by me was 132/88  Wt Readings from Last 3 Encounters:  04/28/23 198 lb 3.2 oz (89.9 kg)  03/29/23 196 lb (88.9 kg)  03/09/23 196 lb (88.9 kg)    General: Alert, oriented, no distress.  Skin: normal turgor, no rashes, warm and  dry HEENT: Normocephalic, atraumatic. Pupils equal round and reactive to light; sclera anicteric; extraocular muscles intact;  Nose without nasal septal hypertrophy Mouth/Parynx benign; Mallinpatti scale 3 Neck: No JVD, no carotid bruits; normal carotid upstroke Lungs: clear to ausculatation and percussion; no wheezing or rales Chest wall: without tenderness to palpitation Heart: PMI not displaced, RRR, s1 s2 normal, 1/6 systolic murmur, no diastolic murmur, no rubs, gallops, thrills, or heaves Abdomen: soft, nontender; no hepatosplenomehaly, BS+; abdominal aorta nontender and not dilated by palpation. Back: no CVA tenderness Pulses 2+ Musculoskeletal: full range of motion, normal strength, no joint deformities Extremities: no clubbing cyanosis or edema, Homan's sign negative  Neurologic: grossly nonfocal; Cranial nerves grossly wnl Psychologic: Normal mood and affect   Studies/Labs Reviewed:   Apr 28, 2023 ECG (independently read by me): NSR at 95, no ectopy  March 09, 2023 ECG (independently read by me): NSR at 78, no ectopy, normal intervals  I personally reviewed the ECG from February 15, 2023 (independently read by me): Normal sinus rhythm at 85 bpm.  Nonspecific T wave abnormality in lead aVL.  Recent Labs:    Latest Ref Rng & Units 04/21/2023    8:51 AM 03/09/2023   12:26 PM 02/08/2023    8:35 AM  BMP  Glucose 70 - 99 mg/dL 96  84  99   BUN 6 - 24 mg/dL 16  14  9    Creatinine 0.76 - 1.27 mg/dL 1.61  0.96  0.45   BUN/Creat Ratio 9 - 20 15  13  8    Sodium 134 - 144 mmol/L 139  139  138   Potassium 3.5 - 5.2 mmol/L 4.4  4.5  4.5   Chloride 96 - 106 mmol/L 102  100  104   CO2 20 - 29 mmol/L 25  23  21    Calcium 8.7 - 10.2 mg/dL 9.5  9.5  8.6         Latest Ref Rng & Units 04/21/2023    8:51 AM 02/08/2023    8:35 AM 02/07/2022   10:00 AM  Hepatic Function  Total Protein 6.0 - 8.5 g/dL 6.9  7.2  6.9   Albumin 4.1 - 5.1 g/dL 4.3  4.6  4.5   AST 0 - 40 IU/L 29  21  18    ALT  0 - 44 IU/L 44  26  17   Alk Phosphatase 44 - 121 IU/L 88  90  92   Total Bilirubin 0.0 - 1.2 mg/dL 0.5  0.5  0.5        Latest Ref Rng & Units 02/08/2023    8:35 AM 02/07/2022   10:00 AM 02/01/2021    8:29 AM  CBC  WBC 3.4 - 10.8 x10E3/uL 5.0  4.6  5.5   Hemoglobin 13.0 - 17.7 g/dL 16.1  09.6  04.5   Hematocrit 37.5 - 51.0 % 45.3  45.6  44.8   Platelets 150 - 450 x10E3/uL 265  249  239    Lab Results  Component Value Date   MCV 90 02/08/2023   MCV 89 02/07/2022   MCV 90 02/01/2021   Lab Results  Component Value Date   TSH 0.992 01/20/2018   Lab Results  Component Value Date   HGBA1C 5.7 (H) 02/08/2023     BNP No results found for: "BNP"  ProBNP No results found for: "PROBNP"   Lipid Panel     Component Value Date/Time   CHOL 143 04/21/2023 0852   TRIG 175 (H) 04/21/2023 0852   HDL 35 (L) 04/21/2023 0852   CHOLHDL 4.1 04/21/2023 0852   LDLCALC 78 04/21/2023 0852   LABVLDL 30 04/21/2023 0852     RADIOLOGY: ECHOCARDIOGRAM COMPLETE  Result Date: 04/21/2023    ECHOCARDIOGRAM REPORT   Patient Name:   Isaac [name] Date of Exam: 04/21/2023 Medical Rec #:  [id number]     Height:       67.0 in Accession #:    [id number]    Weight:       196.0 lb Date of Birth:  02/05/1976      BSA:          2.005 m Patient Age:    46 years      BP:           118/88 mmHg Patient Gender: M             HR:           94 bpm. Exam Location:  [address] Procedure: 3D Echo, 2D Echo, Cardiac Doppler, Color Doppler and Strain Analysis Indications:    R07.9 Chest pain                 R94.31 Abnormal EKG  History:        Patient has prior history of Echocardiogram examinations, most                 recent 01/20/2018.  Sonographer:    Sedonia Small Rodgers-Jones RDCS Referring Phys: [id number] Olga Seyler A Oviya Ammar IMPRESSIONS  1. Left ventricular ejection fraction, by estimation, is 65 to 70%. The left ventricle has normal function. The left ventricle has no regional wall motion abnormalities. Left ventricular diastolic  parameters were normal.  2. Right ventricular systolic function is normal. The right ventricular size is normal.  3. The mitral valve is normal in structure. Trivial mitral valve regurgitation. No evidence of mitral stenosis.  4. The aortic valve is normal in structure. Aortic valve regurgitation is not visualized. No aortic stenosis is present.  5. The inferior vena cava is normal in size with greater than 50% respiratory variability, suggesting right atrial pressure of 3 mmHg. FINDINGS  Left Ventricle: Left ventricular ejection fraction, by estimation, is 65 to 70%. The left ventricle has normal function. The left ventricle has no regional wall motion abnormalities. The left ventricular internal cavity  size was normal in size. There is  no left ventricular hypertrophy. Left ventricular diastolic parameters were normal. Right Ventricle: The right ventricular size is normal. No increase in right ventricular wall thickness. Right ventricular systolic function is normal. Left Atrium: Left atrial size was normal in size. Right Atrium: Right atrial size was normal in size. Pericardium: There is no evidence of pericardial effusion. Mitral Valve: The mitral valve is normal in structure. Trivial mitral valve regurgitation. No evidence of mitral valve stenosis. Tricuspid Valve: The tricuspid valve is normal in structure. Tricuspid valve regurgitation is not demonstrated. No evidence of tricuspid stenosis. Aortic Valve: The aortic valve is normal in structure. Aortic valve regurgitation is not visualized. No aortic stenosis is present. Pulmonic Valve: The pulmonic valve was normal in structure. Pulmonic valve regurgitation is mild. No evidence of pulmonic stenosis. Aorta: The aortic root is normal in size and structure. Venous: The inferior vena cava is normal in size with greater than 50% respiratory variability, suggesting right atrial pressure of 3 mmHg. IAS/Shunts: No atrial level shunt detected by color flow Doppler.   LEFT VENTRICLE PLAX 2D LVIDd:         4.40 cm   Diastology LVIDs:         2.50 cm   LV e' medial:    8.52 cm/s LV PW:         0.80 cm   LV E/e' medial:  8.4 LV IVS:        0.80 cm   LV e' lateral:   11.05 cm/s LVOT diam:     1.80 cm   LV E/e' lateral: 6.4 LV SV:         53 LV SV Index:   26        2D Longitudinal Strain LVOT Area:     2.54 cm  2D Strain GLS (A2C):   -21.8 %                          2D Strain GLS (A3C):   -21.2 %                          2D Strain GLS (A4C):   -22.8 %                          2D Strain GLS Avg:     -21.9 %                           3D Volume EF:                          3D EF:        57 %                          LV EDV:       88 ml                          LV ESV:       38 ml                          LV SV:        50 ml RIGHT VENTRICLE  IVC RV Basal diam:  3.60 cm     IVC diam: 1.00 cm RV S prime:     17.90 cm/s TAPSE (M-mode): 2.3 cm LEFT ATRIUM             Index        RIGHT ATRIUM           Index LA diam:        3.30 cm 1.65 cm/m   RA Area:     10.40 cm LA Vol (A2C):   22.4 ml 11.17 ml/m  RA Volume:   23.50 ml  11.72 ml/m LA Vol (A4C):   25.6 ml 12.77 ml/m LA Biplane Vol: 24.1 ml 12.02 ml/m  AORTIC VALVE LVOT Vmax:   123.33 cm/s LVOT Vmean:  84.667 cm/s LVOT VTI:    0.207 m  AORTA Ao Root diam: 3.80 cm Ao Asc diam:  3.50 cm MITRAL VALVE MV Area (PHT): 4.40 cm    SHUNTS MV Decel Time: 173 msec    Systemic VTI:  0.21 m MV E velocity: 71.25 cm/s  Systemic Diam: 1.80 cm MV A velocity: 85.20 cm/s MV E/A ratio:  0.84 Arvilla Meres MD Electronically signed by Arvilla Meres MD Signature Date/Time: 04/21/2023/3:21:50 PM    Final      Additional studies/ records that were reviewed today include:   I reviewed the records from his 2018 evaluation with Dr. Elberta Fortis.  I reviewed the records of Dr. Mechele Claude at [facility].  CTA: 03/16/2023 FINDINGS: Non-cardiac: See separate report from [facility]. No significant findings on  limited lung and soft tissue windows.   Calcium Score: Mild plaque noted only in LAD   Coronary Arteries: Right dominant with no anomalies   LM: Normal   LAD: 1-24% calcified plaque in proximal vessel   D1: Normal   D2: Normal   D3: Normal   Circumflex: Normal   OM1: Normal   OM2: Normal   AV Groove: Normal   RCA: Normal   PDA: Normal   PLA: Normal   IMPRESSION: 1. Calcium score 7.02 which is 78 th percentile for age/sex   2.  Normal ascending thoracic aorta 3.5 cm   3.  CAD RADS 1 non obstructive CAD see description above   IMPRESSION: No significant extracardiac incidental findings identified.   ECHO: 04/21/2023  1. Left ventricular ejection fraction, by estimation, is 65 to 70%. The  left ventricle has normal function. The left ventricle has no regional  wall motion abnormalities. Left ventricular diastolic parameters were  normal.   2. Right ventricular systolic function is normal. The right ventricular  size is normal.   3. The mitral valve is normal in structure. Trivial mitral valve  regurgitation. No evidence of mitral stenosis.   4. The aortic valve is normal in structure. Aortic valve regurgitation is  not visualized. No aortic stenosis is present.   5. The inferior vena cava is normal in size with greater than 50%  respiratory variability, suggesting right atrial pressure of 3 mmHg.    03/29/2023 CLINICAL INFORMATION Sleep Study Type: HST   Indication for sleep study: snoring, daytime sleepiness, non-restorative sleep   Epworth Sleepiness Score: 18   SLEEP STUDY TECHNIQUE A multi-channel overnight portable sleep study was performed. The channels recorded were: nasal airflow, thoracic respiratory movement, and oxygen saturation with a pulse oximetry. Snoring was also monitored.   MEDICATIONS fexofenadine (ALLEGRA) 180 MG tablet metoprolol tartrate (LOPRESSOR) 50 MG tablet (Expired) PARoxetine (PAXIL) 40 MG tablet  rosuvastatin (CRESTOR)  20 MG tablet Vitamin D, Ergocalciferol, (DRISDOL) 1.25 MG (50000 UNIT) CAPS capsule Patient self administered medications include: N/A.   SLEEP ARCHITECTURE Patient was studied for 364.9 minutes. The sleep efficiency was 100.0 % and the patient was supine for 0%. The arousal index was 0.0 per hour.   RESPIRATORY PARAMETERS The overall AHI was 30.1 per hour, with a central apnea index of 0 per hour. There is a significant positional component with supine sleep AHI 30.6/h and non-supine sleep AHI 12.5/h   The oxygen nadir was 83% during sleep.   CARDIAC DATA Mean heart rate during sleep was 77.8 bpm. Heart rate range: 58 - 104 bpm.   IMPRESSIONS - Severe obstructive sleep apnea occurred during this study (AHI 30.1/h). - Moderate oxygen desaturation to a nadir of 83%. - Patient snored for 129.6 minutes (35.5%) during the sleep.   DIAGNOSIS - Obstructive Sleep Apnea (G47.33)   RECOMMENDATIONS - Recommend CPAP therapy for treatment of his severe sleep disordered breathing. If unable to obtain an in-lab titration, initiate a trial of Auto-PAP with EPR of 3 at 6 - 18 cm of water. - Effort should be made to optimize nasal and oropharyngeal patency. - Positional therapy avoiding supine position during sleep. - Avoid alcohol, sedatives and other CNS depressants that may worsen sleep apnea and disrupt normal sleep architecture. - Sleep hygiene should be reviewed to assess factors that may improve sleep quality. - Weight management and regular exercise should be initiated or continued. - Recommend a download and sleep clinic evaluation after 4 - 6 weeks of therapy.     ASSESSMENT:    1. Chest discomfort: atypical   2. OSA (obstructive sleep apnea)   3. Family history of heart disease   4. 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  In 2018 he had experienced some atypical chest pain which was not felt to be ischemic in  etiology.  He has a history of mild hyperlipidemia and has been on rosuvastatin at low-dose 10 mg for 2 years.  Recently, he experienced intermittent episodes of a punching sensation in his check which seems to be located on the left side of his chest radiating to his shoulder and also experiences discomfort in the left trapezius muscle.  He denies any clear-cut exertional precipitation.  His chest pain has atypical features and raises concern for probable musculoskeletal in etiology.  Since his initial evaluation, he underwent coronary CTA which revealed minimal calcification with a score of 7.02 representing 78th percentile for age and sex.  There was 1-24% calcified plaque in the proximal LAD.  His 2D echo Doppler study reveals hyperdynamic LV function with normal valvular architecture.  He was found to have severe obstructive sleep apnea on a home sleep study with significant positional component.  He has not yet been set up to initiate CPAP therapy.  Based on his insurance [employer] may be his DME company.  I reviewed his sleep study in detail.  I also discussed potential adverse cardiovascular results if severe sleep apnea is left untreated.  I reviewed its potential negative fact on blood pressure, potential for nocturnal arrhythmias, increased risk for atrial fibrillation, as well as effects on insulin resistance, inflammation, and nocturnal GERD.  In addition I discussed potential nocturnal hypoxemia contributing to ischemia.  I also discussed the pathophysiology associated with increased urinary frequency if sleep apnea is untreated.  Once CPAP therapy is initiated,  I will need to see him back in the office within 90 days for establishment of compliance.  I reviewed his most recent laboratory which showed normal chemistry.  Potassium was 4.0.  LFTs were normal.  Lipid studies showed total cholesterol 143 with LDL at 78.  Triglycerides are mildly increased at 175 with an HDL was low.  I discussed reduction in  carbohydrates and sweets.  LP(a) was normal at 14.1.  He continues to be on rosuvastatin 20 mg for hyperlipidemia.  I will see him in several months for follow-up evaluation.  Medication Adjustments/Labs and Tests Ordered: Current medicines are reviewed at length with the patient today.  Concerns regarding medicines are outlined above.  Medication changes, Labs and Tests ordered today are listed in the Patient Instructions below. Patient Instructions  Medication Instructions:  START Metoprolol Succinate 25 mg daily   *If you need a refill on your cardiac medications before your next appointment, please call your pharmacy*   Follow-Up: At [facility], you and your health needs are our priority.  As part of our continuing mission to provide you with exceptional heart care, we have created designated Provider Care Teams.  These Care Teams include your primary Cardiologist (physician) and Advanced Practice Providers (APPs -  Physician Assistants and Nurse Practitioners) who all work together to provide you with the care you need, when you need it.  We recommend signing up for the patient portal called "MyChart".  Sign up information is provided on this After Visit Summary.  MyChart is used to connect with patients for Virtual Visits (Telemedicine).  Patients are able to view lab/test results, encounter notes, upcoming appointments, etc.  Non-urgent messages can be sent to your provider as well.   To learn more about what you can do with MyChart, go to [email or web address].    Your next appointment:   October 2023  Provider:   Nicki Guadalajara, MD       Signed, Nicki Guadalajara, MD, [facility]. ABSM Diplomate, [employer] Board [employer] Sleep Medicine   05/05/2023 11:17 AM    [facility] [address], [place], [place]  [address] Phone: [phone]

## 2023-04-28 NOTE — Patient Instructions (Signed)
Medication Instructions:  START Metoprolol Succinate 25 mg daily   *If you need a refill on your cardiac medications before your next appointment, please call your pharmacy*   Follow-Up: At [facility], you and your health needs are our priority.  As part of our continuing mission to provide you with exceptional heart care, we have created designated Provider Care Teams.  These Care Teams include your primary Cardiologist (physician) and Advanced Practice Providers (APPs -  Physician Assistants and Nurse Practitioners) who all work together to provide you with the care you need, when you need it.  We recommend signing up for the patient portal called "MyChart".  Sign up information is provided on this After Visit Summary.  MyChart is used to connect with patients for Virtual Visits (Telemedicine).  Patients are able to view lab/test results, encounter notes, upcoming appointments, etc.  Non-urgent messages can be sent to your provider as well.   To learn more about what you can do with MyChart, go to [email or web address].    Your next appointment:   October 2023  Provider:   Nicki Guadalajara, MD

## 2023-05-01 ENCOUNTER — Telehealth: Payer: Self-pay

## 2023-05-01 NOTE — Telephone Encounter (Signed)
Patient notified of sleep study results and recommendations. Patient stated his machine and supplies were ordered last week. All questions  (if any) were answered.

## 2023-05-02 ENCOUNTER — Other Ambulatory Visit (INDEPENDENT_AMBULATORY_CARE_PROVIDER_SITE_OTHER): Payer: 59

## 2023-05-02 DIAGNOSIS — G4733 Obstructive sleep apnea (adult) (pediatric): Secondary | ICD-10-CM

## 2023-05-02 DIAGNOSIS — R0683 Snoring: Secondary | ICD-10-CM

## 2023-05-04 NOTE — Telephone Encounter (Signed)
Echo completed and results sent to patient 05/04/23 via MyChart

## 2023-05-05 ENCOUNTER — Encounter: Payer: Self-pay | Admitting: Cardiovascular Disease

## 2023-07-04 ENCOUNTER — Other Ambulatory Visit: Payer: Self-pay | Admitting: Family Medicine

## 2023-09-20 ENCOUNTER — Ambulatory Visit: Payer: 59 | Attending: Cardiovascular Disease | Admitting: Cardiovascular Disease

## 2023-09-20 ENCOUNTER — Encounter: Payer: Self-pay | Admitting: Cardiovascular Disease

## 2023-09-20 DIAGNOSIS — Z8249 Family history of ischemic heart disease and other diseases of the circulatory system: Secondary | ICD-10-CM

## 2023-09-20 DIAGNOSIS — E782 Mixed hyperlipidemia: Secondary | ICD-10-CM

## 2023-09-20 DIAGNOSIS — G4733 Obstructive sleep apnea (adult) (pediatric): Secondary | ICD-10-CM | POA: Diagnosis not present

## 2023-09-20 DIAGNOSIS — R0789 Other chest pain: Secondary | ICD-10-CM

## 2023-09-20 NOTE — Progress Notes (Signed)
Cardiology Office Note    Date:  09/23/2023   ID:  Isaac [name], DOB 1976/11/02, MRN [id number]  PCP:  Mechele Claude, MD  Cardiologist:  Nicki Guadalajara, MD   5 month F/U cardiology/sleep evaluation initially referred by Dr. Darlyn Read for evaluation of chest pain   History of Present Illness:  Isaac [name] is a 47 y.o. male who had recently been evaluated by Dr. Darlyn Read at [facility] family medicine with precordial chest pain radiating to his shoulder and posteriorly to his neck and shoulder blade.  Remotely, in 2018, he was evaluated by Dr. Elberta Fortis with atypical chest pain.  His job was stressful as a Futures trader and at that time, it was felt that his chest pain was atypical in nature, not associated with activity and no cardiovascular workup was undertaken.  I saw him for my initial evaluation on March 09, 2023 and last saw him on Apr 28, 2023.   Isaac [name] admits has experienced intermittent chest pain.  He does not describe it as a chest tightness but feels a "punching sensation.  It occurs on the left side of his chest and is nonexertional and can get transmitted to the left shoulder and trapezius muscle.  He has a history of mild hyperlipidemia and apparently has been on low-dose rosuvastatin 10 mg for the last 2 years.  He had undergone laboratory on February 08, 2023 which showed total cholesterol 188, LDL 114, triglycerides 188, and HDL 41.  Creatinine was 1.17.  Hemoglobin 14.9.  ALT was normal at 26.  Upon further questioning, Isaac [name] states that his sleep is nonrestorative.  He has been told of having very loud snoring.  He does experience occasional nocturia as well as some daytime sleepiness.  He has never been evaluated for sleep apnea.    During my initial evaluation, I recommended that he undergo a 2D echo Doppler study for assessment of LV systolic and diastolic and valvular architecture.  I scheduled him to undergo coronary CTA evaluation with his chest pain and  particularly with his family history for CAD.  Due to concerns for obstructive sleep apnea I recommended he undergo a sleep evaluation.  I recommended follow-up laboratory in several months.  Coronary CTA was done on March 16, 2023.  Calcium score was 7.02 representing 78th percentile.  Minimal plaque was noted in the proximal LAD at 1 to 24%.  Chest CT over read did not reveal any significant extracardiac findings.  On Apr 21, 2023, an echo Doppler study showed normal LV function with EF 65 to 70% with normal diastolic parameters.  Valves were essentially normal.  He underwent a home sleep study on March 29, 2023.  He was found to have severe sleep apnea with an AHI of 30.1/h.  He has a significant positional component with supine sleep AHI at 30.6 and nonsupine sleep at 12.5.  CPAP titration or is AutoPap therapy was recommended.  He has not yet been set up with CPAP therapy.  Presently, he denies any chest pain.  He states most of the time his heart rate is in the 90s.  When I saw him in follow-up on Apr 28, 2023 I reviewed his sleep study in detail and had an extensive discussion with him regarding sleep apnea and its potential adverse cardiovascular consequences if left untreated.  He received a new ResMed AirSense 11 AutoSet CPAP machine with set up date on May 05, 2023.  His initial download from June 1 through June 11, 2023 confirms excellent compliance with 100% use and average use of 7 hours and 13 minutes.  His pressure set at a range of 6 to 18 cm of water.  AHI is 2.3 with his 95th percentile pressure 9.9 with maximum average pressure 11.1.  A subsequent download from September 9 through October 9 again confirms excellent compliance with average usage 6 hours and 56 minutes.  AHI was 1.5.  He is sleeping well.  He has more energy.  He uses nasal pillow mask.  He denies any chest pain.  He presents for evaluation.   Past Medical History:  Diagnosis Date   Anxiety    Depression with anxiety  02/03/2020   Kidney stones     Past Surgical History:  Procedure Laterality Date   ELBOW SURGERY Left    nerve release    EXTRACORPOREAL SHOCK WAVE LITHOTRIPSY Left 04/13/2017   Procedure: LEFT EXTRACORPOREAL SHOCK WAVE LITHOTRIPSY (ESWL);  Surgeon: Jethro Bolus, MD;  Location: [facility];  Service: Urology;  Laterality: Left;   WISDOM TOOTH EXTRACTION  1995    Current Medications: Outpatient Medications Prior to Visit  Medication Sig Dispense Refill   metoprolol succinate (TOPROL XL) 25 MG 24 hr tablet Take 1 tablet (25 mg total) by mouth daily. 90 tablet 3   PARoxetine (PAXIL) 40 MG tablet Take 1 tablet (40 mg total) by mouth daily. Needs to be seen 90 tablet 3   rosuvastatin (CRESTOR) 20 MG tablet Take 1 tablet (20 mg total) by mouth daily. 90 tablet 3   Vitamin D, Ergocalciferol, (DRISDOL) 1.25 MG (50000 UNIT) CAPS capsule Take 1 capsule by mouth every 7 days. 13 capsule 3   fexofenadine (ALLEGRA) 180 MG tablet Take 1 tablet (180 mg total) by mouth daily. For allergy symptoms (Patient not taking: Reported on 04/28/2023) 90 tablet 3   No facility-administered medications prior to visit.     Allergies:   Patient has no known allergies.   Social History   Socioeconomic History   Marital status: Married    Spouse name: Not on file   Number of children: 3   Years of education: 14   Highest education level: Not on file  Occupational History   Occupation: computer crimes International aid/development worker    Employer: [employer]  Tobacco Use   Smoking status: Former   Smokeless tobacco: Former    Types: Associate Professor status: Never Used  Substance and Sexual Activity   Alcohol use: No   Drug use: No   Sexual activity: Yes    Partners: Female  Other Topics Concern   Not on file  Social History Narrative   Lives with wife and 3 children in a one story home.  Works as a Investment banker, corporate.  Education: some college.    Social Determinants of Health   Financial  Resource Strain: Not on file  Food Insecurity: Not on file  Transportation Needs: Not on file  Physical Activity: Not on file  Stress: Not on file  Social Connections: Not on file    Social history is notable that he was born in [place].  He lives in [place].  He is married for 25 years.  He has 3 children.  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  He completed 12 grade.  There is remote tobacco history of 11 years with quit date 2007.  He drinks occasional whiskey.  He does not routinely exercise but admits  to being active.  Family History:  The patient's family history includes Anxiety disorder in his daughter; Asthma in his brother and daughter; Diabetes in his father, paternal grandfather, and paternal grandmother; Hearing loss in his maternal grandfather; Heart disease in his father, paternal grandfather, and paternal grandmother; Hyperlipidemia in his father, paternal grandfather, and paternal grandmother; Hypertension in his father, paternal grandfather, and paternal grandmother.   Mother is living at age 18 and has multiple sclerosis.  Father is living at age 47 and is status post CABG revascularization surgery and is diabetic and has heart failure.  His children are 23 and 20 who are daughters and he has a 26 year old son.  ROS General: Negative; No fevers, chills, or night sweats;  HEENT: Negative; No changes in vision or hearing, sinus congestion, difficulty swallowing Pulmonary: Negative; No cough, wheezing, shortness of breath, hemoptysis Cardiovascular: See HPI GI: Negative; No nausea, vomiting, diarrhea, or abdominal pain GU: Negative; No dysuria, hematuria, or difficulty voiding Musculoskeletal: Negative; no myalgias, joint pain, or weakness Hematologic/Oncology: Negative; no easy bruising, bleeding Endocrine: Negative; no heat/cold intolerance; no diabetes Neuro: Negative; no changes in balance, headaches Skin: Negative; No rashes or skin  lesions Psychiatric: Negative; No behavioral problems, depression Sleep: Positive for severe sleep apnea on home sleep study.  Positive snoring, daytime sleepiness, nonrestorative sleep prior to treatment.  On OSA since May 05, 2023.  No restless legs, hypnogognic hallucinations, no cataplexy  Other comprehensive 14 point system review is negative.   PHYSICAL EXAM:   VS:  BP 94/64   Pulse 73   Ht 5\' 7"  (1.702 m)   Wt 206 lb 6.4 oz (93.6 kg)   SpO2 96%   BMI 32.33 kg/m     Repeat blood pressure by me was 118/70  Wt Readings from Last 3 Encounters:  09/20/23 206 lb 6.4 oz (93.6 kg)  04/28/23 198 lb 3.2 oz (89.9 kg)  03/29/23 196 lb (88.9 kg)   General: Alert, oriented, no distress.  Skin: normal turgor, no rashes, warm and dry HEENT: Normocephalic, atraumatic. Pupils equal round and reactive to light; sclera anicteric; extraocular muscles intact;  Nose without nasal septal hypertrophy Mouth/Parynx benign; Mallinpatti scale 3 Neck: No JVD, no carotid bruits; normal carotid upstroke Lungs: clear to ausculatation and percussion; no wheezing or rales Chest wall: without tenderness to palpitation Heart: PMI not displaced, RRR, s1 s2 normal, 1/6 systolic murmur, no diastolic murmur, no rubs, gallops, thrills, or heaves Abdomen: soft, nontender; no hepatosplenomehaly, BS+; abdominal aorta nontender and not dilated by palpation. Back: no CVA tenderness Pulses 2+ Musculoskeletal: full range of motion, normal strength, no joint deformities Extremities: no clubbing cyanosis or edema, Homan's sign negative  Neurologic: grossly nonfocal; Cranial nerves grossly wnl Psychologic: Normal mood and affect    Studies/Labs Reviewed:   Apr 28, 2023 ECG (independently read by me): NSR at 95, no ectopy  March 09, 2023 ECG (independently read by me): NSR at 78, no ectopy, normal intervals  I personally reviewed the ECG from February 15, 2023 (independently read by me): Normal sinus rhythm at 85 bpm.   Nonspecific T wave abnormality in lead aVL.  Recent Labs:    Latest Ref Rng & Units 04/21/2023    8:51 AM 03/09/2023   12:26 PM 02/08/2023    8:35 AM  BMP  Glucose 70 - 99 mg/dL 96  84  99   BUN 6 - 24 mg/dL 16  14  9    Creatinine 0.76 - 1.27 mg/dL 1.61  0.96  1.17   BUN/Creat Ratio 9 - 20 15  13  8    Sodium 134 - 144 mmol/L 139  139  138   Potassium 3.5 - 5.2 mmol/L 4.4  4.5  4.5   Chloride 96 - 106 mmol/L 102  100  104   CO2 20 - 29 mmol/L 25  23  21    Calcium 8.7 - 10.2 mg/dL 9.5  9.5  8.6         Latest Ref Rng & Units 04/21/2023    8:51 AM 02/08/2023    8:35 AM 02/07/2022   10:00 AM  Hepatic Function  Total Protein 6.0 - 8.5 g/dL 6.9  7.2  6.9   Albumin 4.1 - 5.1 g/dL 4.3  4.6  4.5   AST 0 - 40 IU/L 29  21  18    ALT 0 - 44 IU/L 44  26  17   Alk Phosphatase 44 - 121 IU/L 88  90  92   Total Bilirubin 0.0 - 1.2 mg/dL 0.5  0.5  0.5        Latest Ref Rng & Units 02/08/2023    8:35 AM 02/07/2022   10:00 AM 02/01/2021    8:29 AM  CBC  WBC 3.4 - 10.8 x10E3/uL 5.0  4.6  5.5   Hemoglobin 13.0 - 17.7 g/dL 56.2  13.0  86.5   Hematocrit 37.5 - 51.0 % 45.3  45.6  44.8   Platelets 150 - 450 x10E3/uL 265  249  239    Lab Results  Component Value Date   MCV 90 02/08/2023   MCV 89 02/07/2022   MCV 90 02/01/2021   Lab Results  Component Value Date   TSH 0.992 01/20/2018   Lab Results  Component Value Date   HGBA1C 5.7 (H) 02/08/2023     BNP No results found for: "BNP"  ProBNP No results found for: "PROBNP"   Lipid Panel     Component Value Date/Time   CHOL 143 04/21/2023 0852   TRIG 175 (H) 04/21/2023 0852   HDL 35 (L) 04/21/2023 0852   CHOLHDL 4.1 04/21/2023 0852   LDLCALC 78 04/21/2023 0852   LABVLDL 30 04/21/2023 0852     RADIOLOGY: No results found.   Additional studies/ records that were reviewed today include:   I reviewed the records from his 2018 evaluation with Dr. Elberta Fortis.  I reviewed the records of Dr. Mechele Claude at [facility]  family medicine.  CTA: 03/16/2023 FINDINGS: Non-cardiac: See separate report from [facility]. No significant findings on limited lung and soft tissue windows.   Calcium Score: Mild plaque noted only in LAD   Coronary Arteries: Right dominant with no anomalies   LM: Normal   LAD: 1-24% calcified plaque in proximal vessel   D1: Normal   D2: Normal   D3: Normal   Circumflex: Normal   OM1: Normal   OM2: Normal   AV Groove: Normal   RCA: Normal   PDA: Normal   PLA: Normal   IMPRESSION: 1. Calcium score 7.02 which is 78 th percentile for age/sex   2.  Normal ascending thoracic aorta 3.5 cm   3.  CAD RADS 1 non obstructive CAD see description above   IMPRESSION: No significant extracardiac incidental findings identified.   ECHO: 04/21/2023  1. Left ventricular ejection fraction, by estimation, is 65 to 70%. The  left ventricle has normal function. The left ventricle has no regional  wall motion abnormalities. Left ventricular diastolic parameters were  normal.   2. Right ventricular systolic function is normal. The right ventricular  size is normal.   3. The mitral valve is normal in structure. Trivial mitral valve  regurgitation. No evidence of mitral stenosis.   4. The aortic valve is normal in structure. Aortic valve regurgitation is  not visualized. No aortic stenosis is present.   5. The inferior vena cava is normal in size with greater than 50%  respiratory variability, suggesting right atrial pressure of 3 mmHg.    03/29/2023 CLINICAL INFORMATION Sleep Study Type: HST   Indication for sleep study: snoring, daytime sleepiness, non-restorative sleep   Epworth Sleepiness Score: 18   SLEEP STUDY TECHNIQUE A multi-channel overnight portable sleep study was performed. The channels recorded were: nasal airflow, thoracic respiratory movement, and oxygen saturation with a pulse oximetry. Snoring was also monitored.   MEDICATIONS fexofenadine  (ALLEGRA) 180 MG tablet metoprolol tartrate (LOPRESSOR) 50 MG tablet (Expired) PARoxetine (PAXIL) 40 MG tablet rosuvastatin (CRESTOR) 20 MG tablet Vitamin D, Ergocalciferol, (DRISDOL) 1.25 MG (50000 UNIT) CAPS capsule Patient self administered medications include: N/A.   SLEEP ARCHITECTURE Patient was studied for 364.9 minutes. The sleep efficiency was 100.0 % and the patient was supine for 0%. The arousal index was 0.0 per hour.   RESPIRATORY PARAMETERS The overall AHI was 30.1 per hour, with a central apnea index of 0 per hour. There is a significant positional component with supine sleep AHI 30.6/h and non-supine sleep AHI 12.5/h   The oxygen nadir was 83% during sleep.   CARDIAC DATA Mean heart rate during sleep was 77.8 bpm. Heart rate range: 58 - 104 bpm.   IMPRESSIONS - Severe obstructive sleep apnea occurred during this study (AHI 30.1/h). - Moderate oxygen desaturation to a nadir of 83%. - Patient snored for 129.6 minutes (35.5%) during the sleep.   DIAGNOSIS - Obstructive Sleep Apnea (G47.33)   RECOMMENDATIONS - Recommend CPAP therapy for treatment of his severe sleep disordered breathing. If unable to obtain an in-lab titration, initiate a trial of Auto-PAP with EPR of 3 at 6 - 18 cm of water. - Effort should be made to optimize nasal and oropharyngeal patency. - Positional therapy avoiding supine position during sleep. - Avoid alcohol, sedatives and other CNS depressants that may worsen sleep apnea and disrupt normal sleep architecture. - Sleep hygiene should be reviewed to assess factors that may improve sleep quality. - Weight management and regular exercise should be initiated or continued. - Recommend a download and sleep clinic evaluation after 4 - 6 weeks of therapy.     ASSESSMENT:    1. OSA (obstructive sleep apnea)   2. Chest discomfort: atypical   3. Mixed hyperlipidemia   4. 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  In 2018 he had experienced atypical chest pain which was not felt to be ischemic in etiology.  He has a history of mild hyperlipidemia and has been on rosuvastatin at low-dose 10 mg for 2 years.  Recently, he experienced intermittent episodes of a punching sensation in his check which seems to be located on the left side of his chest radiating to his shoulder and also experiences discomfort in the left trapezius muscle.  He denies any clear-cut exertional precipitation.  His chest pain has atypical features and raised concern for probable musculoskeletal in etiology.  Coronary CTA revealed minimal calcification with a score of 7.02 representing 78th percentile for  age and sex.  There was 1-24% calcified plaque in the proximal LAD.  His 2D echo Doppler study reveals hyperdynamic LV function with normal valvular architecture.  He was found to have severe obstructive sleep apnea on a home sleep study with significant positional component.  At his last evaluation while he was still waiting to receive a new CPAP machine I discussed   I also discussed potential adverse cardiovascular results if severe sleep apnea is left untreated.  I reviewed its potential negative fact on blood pressure, potential for nocturnal arrhythmias, increased risk for atrial fibrillation, as well as effects on insulin resistance, inflammation, and nocturnal GERD.  In addition I discussed potential nocturnal hypoxemia contributing to ischemia.  I also discussed the pathophysiology associated with increased urinary frequency if sleep apnea is untreated.  He received a new ResMed AirSense 11 AutoSet unit on May 05, 2023.  Subsequent compliance has been excellent with 100% use.  He is averaging approximately 7 hours of CPAP use per night.  His most recent download showed an AHI of 1.5 with his 90/5% pressure 10.1 with maximum average pressure 11.2.  I will change his ramp start pressure from 4 to 6 cm  of water.  I will also change his pressure range from 6-18 to a range of 8 to 18 cm of water.  He is using a nasal pillow mask and does not have any leak.  Clinically he is doing remarkably well.  He continues to be on rosuvastatin 20 mg for hyperlipidemia and is on metoprolol succinate 25 mg.  LP(a) is normal at 14.1.  I will be available in the future as needed from a sleep perspective or if other problems arise.    Medication Adjustments/Labs and Tests Ordered: Current medicines are reviewed at length with the patient today.  Concerns regarding medicines are outlined above.  Medication changes, Labs and Tests ordered today are listed in the Patient Instructions below. Patient Instructions  Medication Instructions:  No changes *If you need a refill on your cardiac medications before your next appointment, please call your pharmacy*   Lab Work: No labs If you have labs (blood work) drawn today and your tests are completely normal, you will receive your results only by: MyChart Message (if you have MyChart) OR A paper copy in the mail If you have any lab test that is abnormal or we need to change your treatment, we will call you to review the results.   Testing/Procedures: None   Follow-Up: At [facility], you and your health needs are our priority.  As part of our continuing mission to provide you with exceptional heart care, we have created designated Provider Care Teams.  These Care Teams include your primary Cardiologist (physician) and Advanced Practice Providers (APPs -  Physician Assistants and Nurse Practitioners) who all work together to provide you with the care you need, when you need it.  We recommend signing up for the patient portal called "MyChart".  Sign up information is provided on this After Visit Summary.  MyChart is used to connect with patients for Virtual Visits (Telemedicine).  Patients are able to view lab/test results, encounter notes, upcoming  appointments, etc.  Non-urgent messages can be sent to your provider as well.   To learn more about what you can do with MyChart, go to [email or web address].    Your next appointment:   Contact the the office for an appointment as needed for sleep concerns.  Provider:   Nicki Guadalajara, MD  Signed, Nicki Guadalajara, MD, [facility]. ABSM Diplomate, [employer] Board [employer] Sleep Medicine   09/23/2023 1:14 PM    [facility] [address], [address], [place], [place]  [address] Phone: [phone]

## 2023-09-20 NOTE — Patient Instructions (Signed)
Medication Instructions:  No changes *If you need a refill on your cardiac medications before your next appointment, please call your pharmacy*   Lab Work: No labs If you have labs (blood work) drawn today and your tests are completely normal, you will receive your results only by: MyChart Message (if you have MyChart) OR A paper copy in the mail If you have any lab test that is abnormal or we need to change your treatment, we will call you to review the results.   Testing/Procedures: None   Follow-Up: At [facility], you and your health needs are our priority.  As part of our continuing mission to provide you with exceptional heart care, we have created designated Provider Care Teams.  These Care Teams include your primary Cardiologist (physician) and Advanced Practice Providers (APPs -  Physician Assistants and Nurse Practitioners) who all work together to provide you with the care you need, when you need it.  We recommend signing up for the patient portal called "MyChart".  Sign up information is provided on this After Visit Summary.  MyChart is used to connect with patients for Virtual Visits (Telemedicine).  Patients are able to view lab/test results, encounter notes, upcoming appointments, etc.  Non-urgent messages can be sent to your provider as well.   To learn more about what you can do with MyChart, go to [email or web address].    Your next appointment:   Contact the the office for an appointment as needed for sleep concerns.  Provider:   Nicki Guadalajara, MD

## 2023-09-23 ENCOUNTER — Encounter: Payer: Self-pay | Admitting: Cardiovascular Disease

## 2023-12-24 ENCOUNTER — Other Ambulatory Visit: Payer: Self-pay | Admitting: Family Medicine

## 2024-01-09 ENCOUNTER — Other Ambulatory Visit: Payer: Self-pay | Admitting: Family Medicine

## 2024-01-09 DIAGNOSIS — F418 Other specified anxiety disorders: Secondary | ICD-10-CM

## 2024-01-10 ENCOUNTER — Other Ambulatory Visit: Payer: Self-pay | Admitting: Cardiovascular Disease

## 2024-01-10 NOTE — Telephone Encounter (Signed)
Stacks NTBS for 6 mos FU should have been in Sept NO RF sent to mail order pharmacy

## 2024-01-10 NOTE — Telephone Encounter (Signed)
SCHEDULED APPT FOR FEB/PHYS. PT SAID HE HAD ENOUGH MEDS TO LAST UNTIL THEN

## 2024-01-31 ENCOUNTER — Encounter: Payer: 59 | Admitting: Family Medicine

## 2024-02-08 ENCOUNTER — Telehealth: Payer: 59 | Admitting: Physician Assistant

## 2024-02-08 DIAGNOSIS — J069 Acute upper respiratory infection, unspecified: Secondary | ICD-10-CM | POA: Diagnosis not present

## 2024-02-08 NOTE — Progress Notes (Signed)

## 2024-02-08 NOTE — Progress Notes (Signed)
 I have spent 5 minutes in review of e-visit questionnaire, review and updating patient chart, medical decision making and response to patient.   Piedad Climes, PA-C

## 2024-02-19 ENCOUNTER — Ambulatory Visit (INDEPENDENT_AMBULATORY_CARE_PROVIDER_SITE_OTHER): Payer: 59 | Admitting: Family Medicine

## 2024-02-19 ENCOUNTER — Encounter: Payer: Self-pay | Admitting: Family Medicine

## 2024-02-19 VITALS — BP 116/76 | HR 61 | Temp 98.1°F | Ht 67.0 in | Wt 188.0 lb

## 2024-02-19 DIAGNOSIS — G4733 Obstructive sleep apnea (adult) (pediatric): Secondary | ICD-10-CM

## 2024-02-19 DIAGNOSIS — Z0001 Encounter for general adult medical examination with abnormal findings: Secondary | ICD-10-CM

## 2024-02-19 DIAGNOSIS — Z125 Encounter for screening for malignant neoplasm of prostate: Secondary | ICD-10-CM

## 2024-02-19 DIAGNOSIS — F418 Other specified anxiety disorders: Secondary | ICD-10-CM

## 2024-02-19 DIAGNOSIS — E782 Mixed hyperlipidemia: Secondary | ICD-10-CM | POA: Diagnosis not present

## 2024-02-19 DIAGNOSIS — Z1211 Encounter for screening for malignant neoplasm of colon: Secondary | ICD-10-CM

## 2024-02-19 DIAGNOSIS — E559 Vitamin D deficiency, unspecified: Secondary | ICD-10-CM

## 2024-02-19 DIAGNOSIS — Z0000 Encounter for general adult medical examination without abnormal findings: Secondary | ICD-10-CM

## 2024-02-19 HISTORY — DX: Obstructive sleep apnea (adult) (pediatric): G47.33

## 2024-02-19 LAB — BAYER DCA HB A1C WAIVED: HB A1C (BAYER DCA - WAIVED): 5.2 % (ref 4.8–5.6)

## 2024-02-19 MED ORDER — METOPROLOL SUCCINATE ER 25 MG PO TB24: 25.0000 mg | ORAL_TABLET | Freq: Every day | ORAL | 3 refills | Status: DC

## 2024-02-19 MED ORDER — PAROXETINE HCL 40 MG PO TABS
40.0000 mg | ORAL_TABLET | Freq: Every day | ORAL | 3 refills | Status: DC
  Filled 2024-10-12 – 2024-10-28 (×2): qty 30, 30d supply, fill #0
  Filled 2024-11-26: qty 30, 30d supply, fill #1

## 2024-02-19 MED ORDER — VITAMIN D (ERGOCALCIFEROL) 1.25 MG (50000 UNIT) PO CAPS
50000.0000 [IU] | ORAL_CAPSULE | ORAL | 1 refills | Status: DC
  Filled 2024-10-12: qty 13, 91d supply, fill #0

## 2024-02-19 MED ORDER — ROSUVASTATIN CALCIUM 20 MG PO TABS
20.0000 mg | ORAL_TABLET | Freq: Every day | ORAL | 3 refills | Status: DC
  Filled 2024-10-12: qty 90, 90d supply, fill #0

## 2024-02-19 NOTE — Progress Notes (Signed)
 Subjective:  Patient ID: Isaac [name], male    DOB: 1976/09/29  Age: 48 y.o. MRN: [id number]  CC: Annual Exam   HPI MALE MINISH presents for CPE  Heart hyperdynamic per echo. Started metoprolol. Dr. Tresa Endo did Sleep study and pt. Found to have OSA. Now doing well International aid/development worker. Weight Down 10 lb. Wants to get to 170-175. Anxiety well controlled with paxil. Attempted taper several years ago and failed. Mixed feelings about tapering again.    in for follow-up of elevated cholesterol. Doing well without complaints on current medication. Denies side effects of statin including myalgia and arthralgia and nausea. Currently no chest pain, shortness of breath or other cardiovascular related symptoms noted. Cardiology recently increased Crestor to 20 mg daily. Had cardiac calcium test, placed in the 76%ile. Now taking metoprolol due to hyperdynamic state of heart noted on echo.       02/19/2024    3:37 PM 02/15/2023    3:21 PM 02/15/2023    3:10 PM  Depression screen PHQ 2/9  Decreased Interest 0 0 0  Down, Depressed, Hopeless 0 0 0  PHQ - 2 Score 0 0 0  Altered sleeping 0 1   Tired, decreased energy 0 0   Change in appetite 0 0   Feeling bad or failure about yourself  0 0   Trouble concentrating 0 0   Moving slowly or fidgety/restless 0 0   Suicidal thoughts 0 0   PHQ-9 Score 0 1   Difficult doing work/chores Not difficult at all Not difficult at all     History Yazen has a past medical history of Anxiety, Depression with anxiety (02/03/2020), Kidney stones, OSA (obstructive sleep apnea) (02/19/2024), and Sleep apnea in adult.   He has a past surgical history that includes Wisdom tooth extraction (1995); Extracorporeal shock wave lithotripsy (Left, 04/13/2017); and Elbow surgery (Left).   His family history includes Anxiety disorder in his daughter; Asthma in his brother and daughter; Diabetes in his father, paternal grandfather, and paternal grandmother; Hearing loss in his maternal  grandfather; Heart disease in his father, paternal grandfather, and paternal grandmother; Hyperlipidemia in his father, paternal grandfather, and paternal grandmother; Hypertension in his father, paternal grandfather, and paternal grandmother.He reports that he has quit smoking. He has quit using smokeless tobacco.  His smokeless tobacco use included chew. He reports that he does not drink alcohol and does not use drugs.    ROS Review of Systems  Constitutional: Negative.   HENT: Negative.    Eyes:  Negative for visual disturbance.  Respiratory:  Negative for cough and shortness of breath.   Cardiovascular:  Negative for chest pain and leg swelling.  Gastrointestinal:  Negative for abdominal pain, diarrhea, nausea and vomiting.  Genitourinary:  Negative for difficulty urinating.  Musculoskeletal:  Negative for arthralgias and myalgias.  Skin:  Negative for rash.  Neurological:  Negative for headaches.  Psychiatric/Behavioral:  Positive for sleep disturbance (Recent Dx of OSA. Sleeping well.). The patient is nervous/anxious (well controlled with meds).     Objective:  BP 116/76   Pulse 61   Temp 98.1 F (36.7 C)   Ht 5\' 7"  (1.702 m)   Wt 188 lb (85.3 kg)   SpO2 97%   BMI 29.44 kg/m   BP Readings from Last 3 Encounters:  02/19/24 116/76  09/20/23 94/64  04/28/23 (!) 138/98    Wt Readings from Last 3 Encounters:  02/19/24 188 lb (85.3 kg)  09/20/23 206 lb 6.4 oz (93.6  kg)  04/28/23 198 lb 3.2 oz (89.9 kg)     Physical Exam Constitutional:      Appearance: He is well-developed.  HENT:     Head: Normocephalic and atraumatic.  Eyes:     Pupils: Pupils are equal, round, and reactive to light.  Neck:     Thyroid: No thyromegaly.     Trachea: No tracheal deviation.  Cardiovascular:     Rate and Rhythm: Normal rate and regular rhythm.     Heart sounds: Normal heart sounds. No murmur heard.    No friction rub. No gallop.  Pulmonary:     Breath sounds: Normal breath  sounds. No wheezing or rales.  Abdominal:     General: Bowel sounds are normal. There is no distension.     Palpations: Abdomen is soft. There is no mass.     Tenderness: There is no abdominal tenderness.     Hernia: There is no hernia in the left inguinal area.  Genitourinary:    Penis: Normal.      Testes: Normal.  Musculoskeletal:        General: Normal range of motion.     Cervical back: Normal range of motion.  Lymphadenopathy:     Cervical: No cervical adenopathy.  Skin:    General: Skin is warm and dry.  Neurological:     Mental Status: He is alert and oriented to person, place, and time.       Assessment & Plan:   [name] was seen today for annual exam.  Diagnoses and all orders for this visit:  Well adult exam -     Bayer DCA Hb A1c Waived -     CBC with Differential/Platelet -     CMP14+EGFR -     Lipid panel -     PSA, total and free -     VITAMIN D 25 Hydroxy (Vit-D Deficiency, Fractures)  Vitamin D deficiency -     VITAMIN D 25 Hydroxy (Vit-D Deficiency, Fractures)  Mixed hyperlipidemia -     Lipid panel  Prostate cancer screening -     PSA, total and free  Depression with anxiety -     PARoxetine (PAXIL) 40 MG tablet; Take 1 tablet (40 mg total) by mouth daily. Needs to be seen  OSA (obstructive sleep apnea)  Screen for colon cancer -     Ambulatory referral to Gastroenterology  Other orders -     metoprolol succinate (TOPROL XL) 25 MG 24 hr tablet; Take 1 tablet (25 mg total) by mouth daily. -     rosuvastatin (CRESTOR) 20 MG tablet; Take 1 tablet (20 mg total) by mouth daily. -     Vitamin D, Ergocalciferol, (DRISDOL) 1.25 MG (50000 UNIT) CAPS capsule; Take 1 capsule (50,000 Units total) by mouth every 7 (seven) days.       I have changed Emmaus A. Guile "Allen"'s rosuvastatin and Vitamin D (Ergocalciferol). I am also having him maintain his fexofenadine, metoprolol succinate, and PARoxetine.  Allergies as of 02/19/2024   No Known  Allergies      Medication List        Accurate as of February 19, 2024  4:40 PM. If you have any questions, ask your nurse or doctor.          fexofenadine 180 MG tablet Commonly known as: ALLEGRA Take 1 tablet (180 mg total) by mouth daily. For allergy symptoms   metoprolol succinate 25 MG 24 hr tablet Commonly known as:  Toprol XL Take 1 tablet (25 mg total) by mouth daily.   PARoxetine 40 MG tablet Commonly known as: PAXIL Take 1 tablet (40 mg total) by mouth daily. Needs to be seen   rosuvastatin 20 MG tablet Commonly known as: CRESTOR Take 1 tablet (20 mg total) by mouth daily. What changed: See the new instructions. Changed by: Rehman Levinson   Vitamin D (Ergocalciferol) 1.25 MG (50000 UNIT) Caps capsule Commonly known as: DRISDOL Take 1 capsule (50,000 Units total) by mouth every 7 (seven) days.         Follow-up: Return in about 1 year (around 02/18/2025).  Mechele Claude, M.D.

## 2024-02-20 ENCOUNTER — Encounter: Payer: Self-pay | Admitting: Family Medicine

## 2024-02-20 LAB — CMP14+EGFR
ALT: 22 IU/L (ref 0–44)
AST: 18 IU/L (ref 0–40)
Albumin: 4.2 g/dL (ref 4.1–5.1)
Alkaline Phosphatase: 78 IU/L (ref 44–121)
BUN/Creatinine Ratio: 14 (ref 9–20)
BUN: 13 mg/dL (ref 6–24)
Bilirubin Total: 0.3 mg/dL (ref 0.0–1.2)
CO2: 23 mmol/L (ref 20–29)
Calcium: 9.2 mg/dL (ref 8.7–10.2)
Chloride: 103 mmol/L (ref 96–106)
Creatinine, Ser: 0.95 mg/dL (ref 0.76–1.27)
Globulin, Total: 2.3 g/dL (ref 1.5–4.5)
Glucose: 97 mg/dL (ref 70–99)
Potassium: 4.2 mmol/L (ref 3.5–5.2)
Sodium: 139 mmol/L (ref 134–144)
Total Protein: 6.5 g/dL (ref 6.0–8.5)
eGFR: 99 mL/min/{1.73_m2} (ref >59)

## 2024-02-20 LAB — CBC WITH DIFFERENTIAL/PLATELET
Basophils Absolute: 0.1 10*3/uL (ref 0.0–0.2)
Basos: 1 %
EOS (ABSOLUTE): 0.2 10*3/uL (ref 0.0–0.4)
Eos: 3 %
Hematocrit: 42.6 % (ref 37.5–51.0)
Hemoglobin: 13.9 g/dL (ref 13.0–17.7)
Immature Grans (Abs): 0 10*3/uL (ref 0.0–0.1)
Immature Granulocytes: 0 %
Lymphocytes Absolute: 1.8 10*3/uL (ref 0.7–3.1)
Lymphs: 34 %
MCH: 29 pg (ref 26.6–33.0)
MCHC: 32.6 g/dL (ref 31.5–35.7)
MCV: 89 fL (ref 79–97)
Monocytes Absolute: 0.4 10*3/uL (ref 0.1–0.9)
Monocytes: 8 %
Neutrophils Absolute: 2.8 10*3/uL (ref 1.4–7.0)
Neutrophils: 54 %
Platelets: 257 10*3/uL (ref 150–450)
RBC: 4.79 x10E6/uL (ref 4.14–5.80)
RDW: 12.2 % (ref 11.6–15.4)
WBC: 5.1 10*3/uL (ref 3.4–10.8)

## 2024-02-20 LAB — PSA, TOTAL AND FREE
PSA, Free Pct: 52 %
PSA, Free: 0.26 ng/mL
Prostate Specific Ag, Serum: 0.5 ng/mL (ref 0.0–4.0)

## 2024-02-20 LAB — LIPID PANEL
Chol/HDL Ratio: 4.5 ratio (ref 0.0–5.0)
Cholesterol, Total: 147 mg/dL (ref 100–199)
HDL: 33 mg/dL — ABNORMAL LOW (ref >39)
LDL Chol Calc (NIH): 69 mg/dL (ref 0–99)
Triglycerides: 277 mg/dL — ABNORMAL HIGH (ref 0–149)
VLDL Cholesterol Cal: 45 mg/dL — ABNORMAL HIGH (ref 5–40)

## 2024-02-20 LAB — VITAMIN D 25 HYDROXY (VIT D DEFICIENCY, FRACTURES): Vit D, 25-Hydroxy: 56.6 ng/mL (ref 30.0–100.0)

## 2024-02-20 NOTE — Progress Notes (Signed)
Hello Kham,  Your lab result is normal and/or stable.Some minor variations that are not significant are commonly marked abnormal, but do not represent any medical problem for you.  Best regards, Robertt Buda, M.D.

## 2024-02-29 ENCOUNTER — Encounter: Payer: Self-pay | Admitting: *Deleted

## 2024-03-11 ENCOUNTER — Other Ambulatory Visit: Payer: Self-pay | Admitting: Cardiovascular Disease

## 2024-06-13 ENCOUNTER — Ambulatory Visit: Admitting: Internal Medicine

## 2024-06-13 VITALS — BP 118/80 | HR 74 | Temp 98.6°F | Ht 67.0 in | Wt 193.0 lb

## 2024-06-13 DIAGNOSIS — K219 Gastro-esophageal reflux disease without esophagitis: Secondary | ICD-10-CM | POA: Diagnosis not present

## 2024-06-13 DIAGNOSIS — K5909 Other constipation: Secondary | ICD-10-CM | POA: Diagnosis not present

## 2024-06-13 DIAGNOSIS — Z1211 Encounter for screening for malignant neoplasm of colon: Secondary | ICD-10-CM

## 2024-06-13 DIAGNOSIS — K5900 Constipation, unspecified: Secondary | ICD-10-CM

## 2024-06-13 NOTE — Patient Instructions (Signed)
 We will schedule you for colonoscopy for colon cancer screening purposes.  Continue on stool softener and fiber therapy.  Continue Tums as needed.  It was very nice meeting you today.  Dr. Cindie

## 2024-06-13 NOTE — Progress Notes (Signed)
 Primary Care Physician:  Zollie Lowers, MD Primary Gastroenterologist:  Dr. Cindie  Chief Complaint  Patient presents with   New Patient (Initial Visit)    Pt here for visit for laxative use from questionnaire for colonoscopy    HPI:   Isaac [name] is a 48 y.o. male who presents to the clinic today by referral from his PCP Dr. Zollie for evaluation.  Colon cancer screening: Due for screening colonoscopy today.  States he had a colonoscopy for unknown reasons many years ago.  He is unsure of results.  No family history of colorectal malignancy.  No melena hematochezia.  No abdominal pain.  No unintentional weight loss.  Chronic constipation: Mild, states he uses an OTC stool softener nightly.  Takes fiber supplement during the day.  Bowels move regular.  Denies any straining.  Overall well-controlled.  Chronic GERD: Symptoms mild, intermittent depending what he eats.  Takes Tums as needed.  Denies any dysphagia odynophagia.  No epigastric or chest pain.  Past Medical History:  Diagnosis Date   Anxiety    Depression with anxiety 02/03/2020   Kidney stones    OSA (obstructive sleep apnea) 02/19/2024   Sleep apnea in adult     Past Surgical History:  Procedure Laterality Date   ELBOW SURGERY Left    nerve release    EXTRACORPOREAL SHOCK WAVE LITHOTRIPSY Left 04/13/2017   Procedure: LEFT EXTRACORPOREAL SHOCK WAVE LITHOTRIPSY (ESWL);  Surgeon: Arlena Gal, MD;  Location: [facility];  Service: Urology;  Laterality: Left;   WISDOM TOOTH EXTRACTION  1995    Current Outpatient Medications  Medication Sig Dispense Refill   metoprolol  succinate (TOPROL -XL) 25 MG 24 hr tablet Take 1 tablet (25 mg total) by mouth daily. 90 tablet 1   PARoxetine  (PAXIL ) 40 MG tablet Take 1 tablet (40 mg total) by mouth daily. Needs to be seen 90 tablet 3   rosuvastatin  (CRESTOR ) 20 MG tablet Take 1 tablet (20 mg total) by mouth daily. 90 tablet 3   Vitamin D , Ergocalciferol , (DRISDOL ) 1.25 MG (50000  UNIT) CAPS capsule Take 1 capsule (50,000 Units total) by mouth every 7 (seven) days. 13 capsule 1   fexofenadine  (ALLEGRA ) 180 MG tablet Take 1 tablet (180 mg total) by mouth daily. For allergy symptoms (Patient not taking: Reported on 06/13/2024) 90 tablet 3   No current facility-administered medications for this visit.    Allergies as of 06/13/2024   (No Known Allergies)    Family History  Problem Relation Age of Onset   Heart disease Father    Hyperlipidemia Father    Diabetes Father    Hypertension Father    Asthma Brother    Asthma Daughter    Anxiety disorder Daughter    Hearing loss Maternal Grandfather    Diabetes Paternal Grandmother    Heart disease Paternal Grandmother    Hyperlipidemia Paternal Grandmother    Hypertension Paternal Grandmother    Heart disease Paternal Grandfather    Hyperlipidemia Paternal Grandfather    Diabetes Paternal Grandfather    Hypertension Paternal Grandfather     Social History   Socioeconomic History   Marital status: Married    Spouse name: Not on file   Number of children: 3   Years of education: 14   Highest education level: Not on file  Occupational History   Occupation: computer crimes Regulatory affairs officer: [employer]  Tobacco Use   Smoking status: Former   Smokeless tobacco: Former  Types: Chew  Vaping Use   Vaping status: Never Used  Substance and Sexual Activity   Alcohol use: No   Drug use: No   Sexual activity: Yes    Partners: Female  Other Topics Concern   Not on file  Social History Narrative   Lives with wife and 3 children in a one story home.  Works as a Investment banker, corporate.  Education: some college.    Social Drivers of Corporate investment banker Strain: Not on file  Food Insecurity: Not on file  Transportation Needs: Not on file  Physical Activity: Not on file  Stress: Not on file  Social Connections: Not on file  Intimate Partner Violence: Not on file     Subjective: Review of Systems  Constitutional:  Negative for chills and fever.  HENT:  Negative for congestion and hearing loss.   Eyes:  Negative for blurred vision and double vision.  Respiratory:  Negative for cough and shortness of breath.   Cardiovascular:  Negative for chest pain and palpitations.  Gastrointestinal:  Positive for constipation and heartburn. Negative for abdominal pain, blood in stool, diarrhea, melena and vomiting.  Genitourinary:  Negative for dysuria and urgency.  Musculoskeletal:  Negative for joint pain and myalgias.  Skin:  Negative for itching and rash.  Neurological:  Negative for dizziness and headaches.  Psychiatric/Behavioral:  Negative for depression. The patient is not nervous/anxious.        Objective: BP 118/80   Pulse 74   Temp 98.6 F (37 C)   Ht 5' 7 (1.702 m)   Wt 193 lb (87.5 kg)   BMI 30.23 kg/m  Physical Exam Constitutional:      Appearance: Normal appearance.  HENT:     Head: Normocephalic and atraumatic.  Eyes:     Extraocular Movements: Extraocular movements intact.     Conjunctiva/sclera: Conjunctivae normal.  Cardiovascular:     Rate and Rhythm: Normal rate and regular rhythm.  Pulmonary:     Effort: Pulmonary effort is normal.     Breath sounds: Normal breath sounds.  Abdominal:     General: Bowel sounds are normal.     Palpations: Abdomen is soft.  Musculoskeletal:        General: Normal range of motion.     Cervical back: Normal range of motion and neck supple.  Skin:    General: Skin is warm.  Neurological:     General: No focal deficit present.     Mental Status: He is alert and oriented to person, place, and time.  Psychiatric:        Mood and Affect: Mood normal.        Behavior: Behavior normal.      Assessment/Plan:  1.  Colon cancer screening- Will schedule for screening colonoscopy.The risks including infection, bleed, or perforation as well as benefits, limitations, alternatives and  imponderables have been reviewed with the patient. Questions have been answered. All parties agreeable.  2.  Chronic constipation-very mild, well-controlled on fiber therapy and nightly stool softener.  3.  Chronic acid reflux-mild, intermittent, continue Tums as needed. 06/13/2024 3:36 PM   Disclaimer: This note was dictated with voice recognition software. Similar sounding words can inadvertently be transcribed and may not be corrected upon review.

## 2024-06-17 ENCOUNTER — Telehealth: Payer: Self-pay | Admitting: *Deleted

## 2024-06-17 NOTE — Telephone Encounter (Signed)
 Called to schedule pt for his procedure. He would like first thing in the morning on a Friday and he is also going on vacation in a couple of weeks, so wants to wait until August. Will call once we get providers schedule.

## 2024-06-18 MED ORDER — CLENPIQ 10-3.5-12 MG-GM -GM/175ML PO SOLN: 1.0000 | ORAL | 0 refills | Status: DC

## 2024-06-18 NOTE — Addendum Note (Signed)
 Addended by: JEANELL GRAEME RAMAN on: 06/18/2024 09:19 AM   Modules accepted: Orders

## 2024-06-18 NOTE — Telephone Encounter (Addendum)
 Spoke with pt. Scheduled for 8/22 at 7:30am. Aware will send instructions via mychart. He request low volume prep. [facility].    Per [facility] Notification/Precertification Requirement This member's plan does not currently require notification or prior-authorization through the [employer] Notification or Prior-Authorization Program. Please contact a Customer Care Professional at [phone] if you believe the information returned to be in error.

## 2024-07-30 ENCOUNTER — Encounter: Payer: Self-pay | Admitting: *Deleted

## 2024-08-02 ENCOUNTER — Ambulatory Visit (HOSPITAL_COMMUNITY): Admitting: Anesthesiology

## 2024-08-02 ENCOUNTER — Encounter (HOSPITAL_COMMUNITY)
Admission: RE | Disposition: A | Discharge status: Home / Self Care | Payer: Self-pay | Source: Home / Self Care | Attending: Internal Medicine

## 2024-08-02 ENCOUNTER — Ambulatory Visit (HOSPITAL_COMMUNITY)
Admission: RE | Admit: 2024-08-02 | Discharge: 2024-08-02 | Disposition: A | Discharge status: Home / Self Care | Attending: Internal Medicine | Admitting: Internal Medicine

## 2024-08-02 ENCOUNTER — Other Ambulatory Visit: Payer: Self-pay

## 2024-08-02 ENCOUNTER — Encounter (HOSPITAL_COMMUNITY): Payer: Self-pay | Admitting: Internal Medicine

## 2024-08-02 DIAGNOSIS — Z1211 Encounter for screening for malignant neoplasm of colon: Secondary | ICD-10-CM | POA: Diagnosis present

## 2024-08-02 DIAGNOSIS — N289 Disorder of kidney and ureter, unspecified: Secondary | ICD-10-CM | POA: Diagnosis not present

## 2024-08-02 DIAGNOSIS — G4733 Obstructive sleep apnea (adult) (pediatric): Secondary | ICD-10-CM | POA: Diagnosis not present

## 2024-08-02 DIAGNOSIS — D122 Benign neoplasm of ascending colon: Secondary | ICD-10-CM | POA: Diagnosis not present

## 2024-08-02 DIAGNOSIS — K635 Polyp of colon: Secondary | ICD-10-CM | POA: Diagnosis not present

## 2024-08-02 DIAGNOSIS — Z87891 Personal history of nicotine dependence: Secondary | ICD-10-CM | POA: Diagnosis not present

## 2024-08-02 DIAGNOSIS — I10 Essential (primary) hypertension: Secondary | ICD-10-CM | POA: Insufficient documentation

## 2024-08-02 DIAGNOSIS — F32A Depression, unspecified: Secondary | ICD-10-CM | POA: Insufficient documentation

## 2024-08-02 DIAGNOSIS — K5730 Diverticulosis of large intestine without perforation or abscess without bleeding: Secondary | ICD-10-CM

## 2024-08-02 DIAGNOSIS — F419 Anxiety disorder, unspecified: Secondary | ICD-10-CM | POA: Diagnosis not present

## 2024-08-02 DIAGNOSIS — Z8249 Family history of ischemic heart disease and other diseases of the circulatory system: Secondary | ICD-10-CM | POA: Insufficient documentation

## 2024-08-02 DIAGNOSIS — K648 Other hemorrhoids: Secondary | ICD-10-CM | POA: Diagnosis not present

## 2024-08-02 DIAGNOSIS — D120 Benign neoplasm of cecum: Secondary | ICD-10-CM | POA: Insufficient documentation

## 2024-08-02 DIAGNOSIS — D123 Benign neoplasm of transverse colon: Secondary | ICD-10-CM | POA: Insufficient documentation

## 2024-08-02 HISTORY — PX: COLONOSCOPY: SHX5424

## 2024-08-02 SURGERY — COLONOSCOPY
Anesthesia: General

## 2024-08-02 MED ORDER — LACTATED RINGERS IV SOLN: INTRAVENOUS | Status: DC | PRN

## 2024-08-02 MED ORDER — LACTATED RINGERS IV SOLN: INTRAVENOUS | Status: DC

## 2024-08-02 MED ORDER — PROPOFOL 500 MG/50ML IV EMUL
INTRAVENOUS | Status: DC | PRN
  Administered 2024-08-02: 150 ug/kg/min via INTRAVENOUS
  Administered 2024-08-02: 100 mg via INTRAVENOUS

## 2024-08-02 NOTE — Discharge Instructions (Addendum)
?  Colonoscopy ?Discharge Instructions ? ?Read the instructions outlined below and refer to this sheet in the next few weeks. These discharge instructions provide you with general information on caring for yourself after you leave the hospital. Your doctor may also give you specific instructions. While your treatment has been planned according to the most current medical practices available, unavoidable complications occasionally occur.  ? ?ACTIVITY ?You may resume your regular activity, but move at a slower pace for the next 24 hours.  ?Take frequent rest periods for the next 24 hours.  ?Walking will help get rid of the air and reduce the bloated feeling in your belly (abdomen).  ?No driving for 24 hours (because of the medicine (anesthesia) used during the test).   ?Do not sign any important legal documents or operate any machinery for 24 hours (because of the anesthesia used during the test).  ?NUTRITION ?Drink plenty of fluids.  ?You may resume your normal diet as instructed by your doctor.  ?Begin with a light meal and progress to your normal diet. Heavy or fried foods are harder to digest and may make you feel sick to your stomach (nauseated).  ?Avoid alcoholic beverages for 24 hours or as instructed.  ?MEDICATIONS ?You may resume your normal medications unless your doctor tells you otherwise.  ?WHAT YOU CAN EXPECT TODAY ?Some feelings of bloating in the abdomen.  ?Passage of more gas than usual.  ?Spotting of blood in your stool or on the toilet paper.  ?IF YOU HAD POLYPS REMOVED DURING THE COLONOSCOPY: ?No aspirin products for 7 days or as instructed.  ?No alcohol for 7 days or as instructed.  ?Eat a soft diet for the next 24 hours.  ?FINDING OUT THE RESULTS OF YOUR TEST ?Not all test results are available during your visit. If your test results are not back during the visit, make an appointment with your caregiver to find out the results. Do not assume everything is normal if you have not heard from your  caregiver or the medical facility. It is important for you to follow up on all of your test results.  ?SEEK IMMEDIATE MEDICAL ATTENTION IF: ?You have more than a spotting of blood in your stool.  ?Your belly is swollen (abdominal distention).  ?You are nauseated or vomiting.  ?You have a temperature over 101.  ?You have abdominal pain or discomfort that is severe or gets worse throughout the day.  ? ?Your colonoscopy revealed 3 polyp(s) which I removed successfully. Await pathology results, my office will contact you. I recommend repeating colonoscopy in 5 years for surveillance purposes. You also have diverticulosis and internal hemorrhoids. I would recommend increasing fiber in your diet or adding OTC Benefiber/Metamucil. Be sure to drink at least 4 to 6 glasses of water daily. Follow-up with GI as needed. ? ? ? ?I hope you have a great rest of your week! ? ?Charles K. Carver, D.O. ?Gastroenterology and Hepatology ?[facility] ? ?

## 2024-08-02 NOTE — Op Note (Signed)
 [facility] Patient Name: Isaac [name] Procedure Date: 08/02/2024 6:52 AM MRN: [id number] Date of Birth: [birth year] Attending MD: Carlin POUR. Cindie , [place], [id number] CSN: [id number] Age: 48 Admit Type: Outpatient Procedure:                Colonoscopy Indications:              Screening for colorectal malignant neoplasm Providers:                Carlin POUR. Cindie, DO, Leandrew Edelman RN, RN, Dorcas Lenis, Technician Referring MD:              Medicines:                See the Anesthesia note for documentation of the                            administered medications Complications:            No immediate complications. Estimated Blood Loss:     Estimated blood loss was minimal. Procedure:                Pre-Anesthesia Assessment:                           - The anesthesia plan was to use monitored                            anesthesia care (MAC).                           After obtaining informed consent, the colonoscope                            was passed under direct vision. Throughout the                            procedure, the patient's blood pressure, pulse, and                            oxygen saturations were monitored continuously. The                            PCF-HQ190L ([id number]) Peds Colon was introduced                            through the anus and advanced to the the cecum,                            identified by appendiceal orifice and ileocecal                            valve. The colonoscopy was performed without                            difficulty. The patient tolerated the  procedure                            well. The quality of the bowel preparation was                            evaluated using the BBPS [facility] Bowel Preparation                            Scale) with scores of: Right Colon = 3, Transverse                            Colon = 3 and Left Colon = 3 (entire mucosa seen                            well with no  residual staining, small fragments of                            stool or opaque liquid). The total BBPS score                            equals 9. Scope In: 7:35:24 AM Scope Out: 7:47:46 AM Scope Withdrawal Time: 0 hours 9 minutes 22 seconds  Total Procedure Duration: 0 hours 12 minutes 22 seconds  Findings:      Non-bleeding internal hemorrhoids were found.      Scattered medium-mouthed and small-mouthed diverticula were found in the       sigmoid colon and transverse colon.      A 2 mm polyp was found in the ascending colon. The polyp was sessile.       The polyp was removed with a cold biopsy forceps. Resection and       retrieval were complete.      A 6 mm polyp was found in the cecum. The polyp was sessile. The polyp       was removed with a cold snare. Resection and retrieval were complete.      A 5 mm polyp was found in the transverse colon. The polyp was sessile.       The polyp was removed with a cold snare. Resection and retrieval were       complete. Impression:               - Non-bleeding internal hemorrhoids.                           - Diverticulosis in the sigmoid colon and in the                            transverse colon.                           - One 2 mm polyp in the ascending colon, removed                            with a cold biopsy forceps. Resected and retrieved.                           -  One 6 mm polyp in the cecum, removed with a cold                            snare. Resected and retrieved.                           - One 5 mm polyp in the transverse colon, removed                            with a cold snare. Resected and retrieved. Moderate Sedation:      Per Anesthesia Care Recommendation:           - Patient has a contact number available for                            emergencies. The signs and symptoms of potential                            delayed complications were discussed with the                            patient. Return to normal  activities tomorrow.                            Written discharge instructions were provided to the                            patient.                           - Resume previous diet.                           - Continue present medications.                           - Await pathology results.                           - Repeat colonoscopy in 5 years for surveillance.                           - Return to GI clinic PRN. Procedure Code(s):        --- Professional ---                           [phone], Colonoscopy, flexible; with removal of                            tumor(s), polyp(s), or other lesion(s) by snare                            technique                           45380, 59, Colonoscopy, flexible; with biopsy,  single or multiple Diagnosis Code(s):        --- Professional ---                           Z12.11, Encounter for screening for malignant                            neoplasm of colon                           K64.8, Other hemorrhoids                           D12.2, Benign neoplasm of ascending colon                           D12.0, Benign neoplasm of cecum                           D12.3, Benign neoplasm of transverse colon (hepatic                            flexure or splenic flexure)                           K57.30, Diverticulosis of large intestine without                            perforation or abscess without bleeding CPT copyright 2022 American Medical Association. All rights reserved. The codes documented in this report are preliminary and upon coder review may  be revised to meet current compliance requirements. Carlin POUR. Cindie, DO Carlin POUR. Cindie, DO 08/02/2024 7:51:30 AM This report has been signed electronically. Number of Addenda: 0

## 2024-08-02 NOTE — Transfer of Care (Signed)
 Immediate Anesthesia Transfer of Care Note  Patient: Isaac [name]  Procedure(s) Performed: COLONOSCOPY  Patient Location: Endoscopy Unit  Anesthesia Type:General  Level of Consciousness: drowsy  Airway & Oxygen Therapy: Patient Spontanous Breathing  Post-op Assessment: Report given to RN and Post -op Vital signs reviewed and stable  Post vital signs: Reviewed and stable  Last Vitals:  Vitals Value Taken Time  BP 85/53 08/02/24 07:51  Temp 36.5 C 08/02/24 07:51  Pulse 62 08/02/24 07:51  Resp 20 08/02/24 07:51  SpO2 95 % 08/02/24 07:51    Last Pain:  Vitals:   08/02/24 0751  TempSrc: Oral  PainSc: 0-No pain      Patients Stated Pain Goal: 6 (08/02/24 9360)  Complications: No notable events documented.

## 2024-08-02 NOTE — H&P (Signed)
 Primary Care Physician:  Zollie Lowers, MD Primary Gastroenterologist:  Dr. Cindie  Pre-Procedure History & Physical: HPI:  Isaac [name] is a 48 y.o. male is here for a colonoscopy for colon cancer screening purposes.  Patient denies any family history of colorectal cancer.  No melena or hematochezia.  No abdominal pain or unintentional weight loss.  No change in bowel habits.  Overall feels well from a GI standpoint.  Past Medical History:  Diagnosis Date   Anxiety    Depression with anxiety 02/03/2020   Kidney stones    OSA (obstructive sleep apnea) 02/19/2024   Sleep apnea in adult     Past Surgical History:  Procedure Laterality Date   ELBOW SURGERY Left    nerve release    EXTRACORPOREAL SHOCK WAVE LITHOTRIPSY Left 04/13/2017   Procedure: LEFT EXTRACORPOREAL SHOCK WAVE LITHOTRIPSY (ESWL);  Surgeon: Arlena Gal, MD;  Location: [facility];  Service: Urology;  Laterality: Left;   WISDOM TOOTH EXTRACTION  1995    Prior to Admission medications   Medication Sig Start Date End Date Taking? Authorizing Provider  metoprolol  succinate (TOPROL -XL) 25 MG 24 hr tablet Take 1 tablet (25 mg total) by mouth daily. 03/11/24  Yes Burnard Debby DELENA, MD  PARoxetine  (PAXIL ) 40 MG tablet Take 1 tablet (40 mg total) by mouth daily. Needs to be seen 02/19/24  Yes Stacks, Lowers, MD  rosuvastatin  (CRESTOR ) 20 MG tablet Take 1 tablet (20 mg total) by mouth daily. 02/19/24  Yes Stacks, Lowers, MD  Vitamin D , Ergocalciferol , (DRISDOL ) 1.25 MG (50000 UNIT) CAPS capsule Take 1 capsule (50,000 Units total) by mouth every 7 (seven) days. 02/19/24  Yes Zollie Lowers, MD  fexofenadine  (ALLEGRA ) 180 MG tablet Take 1 tablet (180 mg total) by mouth daily. For allergy symptoms Patient not taking: Reported on 06/13/2024 02/15/23   Zollie Lowers, MD  Sod Picosulfate-Mag Ox-Cit Acd (CLENPIQ ) 10-3.5-12 MG-GM -GM/175ML SOLN Take 1 kit by mouth as directed. 06/18/24   Cindie Carlin POUR, DO    Allergies as of 06/18/2024   (No  Known Allergies)    Family History  Problem Relation Age of Onset   Heart disease Father    Hyperlipidemia Father    Diabetes Father    Hypertension Father    Asthma Brother    Asthma Daughter    Anxiety disorder Daughter    Hearing loss Maternal Grandfather    Diabetes Paternal Grandmother    Heart disease Paternal Grandmother    Hyperlipidemia Paternal Grandmother    Hypertension Paternal Grandmother    Heart disease Paternal Grandfather    Hyperlipidemia Paternal Grandfather    Diabetes Paternal Grandfather    Hypertension Paternal Grandfather     Social History   Socioeconomic History   Marital status: Married    Spouse name: Not on file   Number of children: 3   Years of education: 14   Highest education level: Not on file  Occupational History   Occupation: computer crimes Regulatory affairs officer: [employer]  Tobacco Use   Smoking status: Former   Smokeless tobacco: Former    Types: Associate Professor status: Never Used  Substance and Sexual Activity   Alcohol use: No   Drug use: No   Sexual activity: Yes    Partners: Female  Other Topics Concern   Not on file  Social History Narrative   Lives with wife and 3 children in a one story home.  Works as a Animator  crimes International aid/development worker.  Education: some college.    Social Drivers of Corporate investment banker Strain: Not on file  Food Insecurity: Not on file  Transportation Needs: Not on file  Physical Activity: Not on file  Stress: Not on file  Social Connections: Not on file  Intimate Partner Violence: Not on file    Review of Systems: See HPI, otherwise negative ROS  Physical Exam: Vital signs in last 24 hours: Temp:  [97.7 F (36.5 C)] 97.7 F (36.5 C) (08/22 0653) Pulse Rate:  [60] 60 (08/22 0653) Resp:  [21] 21 (08/22 0653) BP: (123)/(90) 123/90 (08/22 0653) SpO2:  [98 %] 98 % (08/22 0653) Weight:  [84.4 kg] 84.4 kg (08/22 0639)   General:   Alert,  Well-developed,  well-nourished, pleasant and cooperative in NAD Head:  Normocephalic and atraumatic. Eyes:  Sclera clear, no icterus.   Conjunctiva pink. Ears:  Normal auditory acuity. Nose:  No deformity, discharge,  or lesions. Msk:  Symmetrical without gross deformities. Normal posture. Extremities:  Without clubbing or edema. Neurologic:  Alert and  oriented x4;  grossly normal neurologically. Skin:  Intact without significant lesions or rashes. Psych:  Alert and cooperative. Normal mood and affect.  Impression/Plan: Isaac [name] is here for a colonoscopy to be performed for colon cancer screening purposes.  The risks of the procedure including infection, bleed, or perforation as well as benefits, limitations, alternatives and imponderables have been reviewed with the patient. Questions have been answered. All parties agreeable.

## 2024-08-02 NOTE — Anesthesia Preprocedure Evaluation (Signed)
 Anesthesia Evaluation  Patient identified by MRN, date of birth, ID band Patient awake    Reviewed: Allergy & Precautions, H&P , NPO status , Patient's Chart, lab work & pertinent test results, reviewed documented beta blocker date and time   Airway Mallampati: II  TM Distance: >3 FB Neck ROM: full    Dental no notable dental hx.    Pulmonary sleep apnea , former smoker   Pulmonary exam normal breath sounds clear to auscultation       Cardiovascular Exercise Tolerance: Good hypertension, negative cardio ROS  Rhythm:regular Rate:Normal     Neuro/Psych  PSYCHIATRIC DISORDERS Anxiety Depression    negative neurological ROS     GI/Hepatic negative GI ROS, Neg liver ROS,,,  Endo/Other  negative endocrine ROS    Renal/GU Renal disease  negative genitourinary   Musculoskeletal   Abdominal   Peds  Hematology negative hematology ROS (+)   Anesthesia Other Findings   Reproductive/Obstetrics negative OB ROS                              Anesthesia Physical Anesthesia Plan  ASA: 2  Anesthesia Plan: General   Post-op Pain Management:    Induction:   PONV Risk Score and Plan: Propofol  infusion  Airway Management Planned:   Additional Equipment:   Intra-op Plan:   Post-operative Plan:   Informed Consent: I have reviewed the patients History and Physical, chart, labs and discussed the procedure including the risks, benefits and alternatives for the proposed anesthesia with the patient or authorized representative who has indicated his/her understanding and acceptance.     Dental Advisory Given  Plan Discussed with: CRNA  Anesthesia Plan Comments:         Anesthesia Quick Evaluation

## 2024-08-05 ENCOUNTER — Encounter (HOSPITAL_COMMUNITY): Payer: Self-pay | Admitting: Internal Medicine

## 2024-08-05 LAB — SURGICAL PATHOLOGY

## 2024-08-10 NOTE — Anesthesia Postprocedure Evaluation (Signed)
 Anesthesia Post Note  Patient: Isaac [name]  Procedure(s) Performed: COLONOSCOPY  Patient location during evaluation: Phase II Anesthesia Type: General Level of consciousness: awake Pain management: pain level controlled Vital Signs Assessment: post-procedure vital signs reviewed and stable Respiratory status: spontaneous breathing and respiratory function stable Cardiovascular status: blood pressure returned to baseline and stable Postop Assessment: no headache and no apparent nausea or vomiting Anesthetic complications: no Comments: Late entry   No notable events documented.   Last Vitals:  Vitals:   08/02/24 0759 08/02/24 0801  BP: (!) 85/58 105/77  Pulse: (!) 58 62  Resp: 16 16  Temp:    SpO2: 95% 93%    Last Pain:  Vitals:   08/02/24 0751  TempSrc: Oral  PainSc: 0-No pain                 Yvonna JINNY Bosworth

## 2024-08-13 ENCOUNTER — Ambulatory Visit: Payer: Self-pay | Admitting: Internal Medicine

## 2024-10-11 ENCOUNTER — Other Ambulatory Visit (HOSPITAL_COMMUNITY): Payer: Self-pay

## 2024-10-12 ENCOUNTER — Other Ambulatory Visit (HOSPITAL_COMMUNITY): Payer: Self-pay

## 2024-10-12 MED ORDER — METOPROLOL SUCCINATE ER 25 MG PO TB24
25.0000 mg | ORAL_TABLET | Freq: Every day | ORAL | 6 refills | Status: DC
  Filled 2024-10-12 – 2024-11-04 (×2): qty 30, 30d supply, fill #0
  Filled 2024-12-08: qty 30, 30d supply, fill #1

## 2024-10-14 ENCOUNTER — Other Ambulatory Visit (HOSPITAL_COMMUNITY): Payer: Self-pay

## 2024-10-16 ENCOUNTER — Other Ambulatory Visit (HOSPITAL_COMMUNITY): Payer: Self-pay

## 2024-10-21 ENCOUNTER — Other Ambulatory Visit (HOSPITAL_COMMUNITY): Payer: Self-pay

## 2024-10-28 ENCOUNTER — Other Ambulatory Visit (HOSPITAL_COMMUNITY): Payer: Self-pay

## 2024-10-30 ENCOUNTER — Other Ambulatory Visit (HOSPITAL_COMMUNITY): Payer: Self-pay

## 2024-11-04 ENCOUNTER — Other Ambulatory Visit: Payer: Self-pay

## 2024-11-04 ENCOUNTER — Other Ambulatory Visit (HOSPITAL_COMMUNITY): Payer: Self-pay

## 2024-11-05 ENCOUNTER — Other Ambulatory Visit (HOSPITAL_COMMUNITY): Payer: Self-pay

## 2024-11-05 ENCOUNTER — Other Ambulatory Visit: Payer: Self-pay

## 2024-11-21 ENCOUNTER — Encounter: Admitting: Sports Medicine

## 2024-11-28 ENCOUNTER — Other Ambulatory Visit: Payer: Self-pay | Admitting: General Practice

## 2024-12-19 ENCOUNTER — Other Ambulatory Visit: Payer: Self-pay | Admitting: General Practice

## 2024-12-20 ENCOUNTER — Ambulatory Visit: Admitting: Cardiology

## 2024-12-26 ENCOUNTER — Other Ambulatory Visit (HOSPITAL_COMMUNITY): Payer: Self-pay

## 2024-12-26 ENCOUNTER — Other Ambulatory Visit: Payer: Self-pay

## 2024-12-26 ENCOUNTER — Encounter: Payer: Self-pay | Admitting: Sports Medicine

## 2024-12-26 ENCOUNTER — Ambulatory Visit: Admitting: Sports Medicine

## 2024-12-26 VITALS — BP 113/79 | HR 69 | Temp 98.0°F | Ht 68.0 in | Wt 204.0 lb

## 2024-12-26 DIAGNOSIS — Z1329 Encounter for screening for other suspected endocrine disorder: Secondary | ICD-10-CM

## 2024-12-26 DIAGNOSIS — F411 Generalized anxiety disorder: Secondary | ICD-10-CM

## 2024-12-26 DIAGNOSIS — I10 Essential (primary) hypertension: Secondary | ICD-10-CM | POA: Diagnosis not present

## 2024-12-26 DIAGNOSIS — K6289 Other specified diseases of anus and rectum: Secondary | ICD-10-CM | POA: Insufficient documentation

## 2024-12-26 DIAGNOSIS — E785 Hyperlipidemia, unspecified: Secondary | ICD-10-CM

## 2024-12-26 DIAGNOSIS — Z131 Encounter for screening for diabetes mellitus: Secondary | ICD-10-CM | POA: Diagnosis not present

## 2024-12-26 DIAGNOSIS — G4733 Obstructive sleep apnea (adult) (pediatric): Secondary | ICD-10-CM | POA: Diagnosis not present

## 2024-12-26 DIAGNOSIS — K921 Melena: Secondary | ICD-10-CM | POA: Insufficient documentation

## 2024-12-26 DIAGNOSIS — Z113 Encounter for screening for infections with a predominantly sexual mode of transmission: Secondary | ICD-10-CM

## 2024-12-26 DIAGNOSIS — K602 Anal fissure, unspecified: Secondary | ICD-10-CM | POA: Insufficient documentation

## 2024-12-26 MED ORDER — METOPROLOL SUCCINATE ER 25 MG PO TB24
25.0000 mg | ORAL_TABLET | Freq: Every day | ORAL | 0 refills | Status: AC
  Filled 2024-12-26: qty 90, 90d supply, fill #0
  Filled 2025-01-16: qty 30, 30d supply, fill #0

## 2024-12-26 MED ORDER — VENLAFAXINE HCL ER 75 MG PO CP24
75.0000 mg | ORAL_CAPSULE | Freq: Every day | ORAL | 0 refills | Status: AC
  Filled 2024-12-26 – 2024-12-27 (×2): qty 30, 30d supply, fill #0

## 2024-12-26 MED ORDER — ROSUVASTATIN CALCIUM 20 MG PO TABS
20.0000 mg | ORAL_TABLET | Freq: Every day | ORAL | 0 refills | Status: AC
  Filled 2024-12-26: qty 90, 90d supply, fill #0
  Filled 2024-12-27 (×2): qty 30, 30d supply, fill #0

## 2024-12-26 NOTE — Progress Notes (Signed)
 "  New Patient Office Visit  Patient ID: Isaac [name], Male   DOB: [birth year] 48 y.o. MRN: [id number] Subjective:     Discussed the use of AI scribe software for clinical note transcription with the patient, who gave verbal consent to proceed.  History of Present Illness  Isaac [name] is a 49 year old male who presents for a new patient visit and medication management for anxiety.  He has been experiencing anxiety symptoms for several years, characterized by stress and worry about events months in advance, affecting his sleep and causing restlessness and irritability. These symptoms occur about half the time or less, with difficulty relaxing at night and daily restlessness. He has been on paroxetine  for about 15 years.  He is also on metoprolol , prescribed about a year ago after experiencing chest pain and a high heart rate. He underwent testing at that time. He uses a CPAP machine for sleep apnea, diagnosed around the same time. He has experienced weight fluctuations but is currently at a similar weight to when he was diagnosed.  He takes Crestor  for cholesterol management. His last blood work was done in January of the previous year. He has a history of smoking, having quit approximately 18-19 years ago after smoking for about 10 years. He does not exercise regularly but remains active during warmer months due to outdoor activities on his property.  He reports occasional loose stools, sinus drainage, and throat clearing. No depressive symptoms, breathing difficulties, chest discomfort, or heartburn that are not managed with over-the-counter medications.   Outpatient Encounter Medications as of 12/26/2024  Medication Sig   metoprolol  succinate (TOPROL -XL) 25 MG 24 hr tablet Take 1 tablet (25 mg total) by mouth daily.   venlafaxine  XR (EFFEXOR  XR) 75 MG 24 hr capsule Take 1 capsule (75 mg total) by mouth daily with breakfast.   [DISCONTINUED] metoprolol  succinate (TOPROL -XL) 25 MG 24  hr tablet Take 1 tablet (25 mg total) by mouth daily.   [DISCONTINUED] PARoxetine  (PAXIL ) 40 MG tablet Take 1 tablet (40 mg total) by mouth daily. Needs to be seen   [DISCONTINUED] rosuvastatin  (CRESTOR ) 20 MG tablet Take 1 tablet (20 mg total) by mouth daily.   fexofenadine  (ALLEGRA ) 180 MG tablet Take 1 tablet (180 mg total) by mouth daily. For allergy symptoms (Patient not taking: Reported on 12/26/2024)   metoprolol  succinate (TOPROL -XL) 25 MG 24 hr tablet Take 1 tablet (25 mg total) by mouth daily.   rosuvastatin  (CRESTOR ) 20 MG tablet Take 1 tablet (20 mg total) by mouth daily.   [DISCONTINUED] Vitamin D , Ergocalciferol , (DRISDOL ) 1.25 MG (50000 UNIT) CAPS capsule Take 1 capsule (50,000 Units total) by mouth every 7 (seven) days. (Patient not taking: Reported on 12/26/2024)   No facility-administered encounter medications on file as of 12/26/2024.    Past Medical History:  Diagnosis Date   Anxiety    Depression with anxiety 02/03/2020   Kidney stones    OSA (obstructive sleep apnea) 02/19/2024   Sleep apnea in adult     Past Surgical History:  Procedure Laterality Date   COLONOSCOPY N/A 08/02/2024   Procedure: COLONOSCOPY;  Surgeon: Cindie Carlin POUR, DO;  Location: [facility];  Service: Endoscopy;  Laterality: N/A;  730am, asa 2   ELBOW SURGERY Left    nerve release    EXTRACORPOREAL SHOCK WAVE LITHOTRIPSY Left 04/13/2017   Procedure: LEFT EXTRACORPOREAL SHOCK WAVE LITHOTRIPSY (ESWL);  Surgeon: Arlena Gal, MD;  Location: [facility];  Service: Urology;  Laterality: Left;  WISDOM TOOTH EXTRACTION  1995    Family History  Problem Relation Age of Onset   Heart disease Father    Hyperlipidemia Father    Diabetes Father    Hypertension Father    Asthma Brother    Asthma Daughter    Anxiety disorder Daughter    Hearing loss Maternal Grandfather    Diabetes Paternal Grandmother    Heart disease Paternal Grandmother    Hyperlipidemia Paternal Grandmother    Hypertension  Paternal Grandmother    Heart disease Paternal Grandfather    Hyperlipidemia Paternal Grandfather    Diabetes Paternal Grandfather    Hypertension Paternal Grandfather     Social History   Socioeconomic History   Marital status: Married    Spouse name: Not on file   Number of children: 3   Years of education: 14   Highest education level: Not on file  Occupational History   Occupation: computer crimes Regulatory Affairs Officer: [employer]  Tobacco Use   Smoking status: Former   Smokeless tobacco: Former    Types: Chew   Tobacco comments:    Quit 20 yrs ago  Advertising Account Planner   Vaping status: Never Used  Substance and Sexual Activity   Alcohol use: No   Drug use: No   Sexual activity: Yes    Partners: Female  Other Topics Concern   Not on file  Social History Narrative   Lives with wife and 3 children in a one story home.  Works as a investment banker, corporate.  Education: some college.    Social Drivers of Health   Tobacco Use: Medium Risk (12/26/2024)   Patient History    Smoking Tobacco Use: Former    Smokeless Tobacco Use: Former    Passive Exposure: Not on Stage Manager: Not on file  Food Insecurity: Not on file  Transportation Needs: Not on file  Physical Activity: Not on file  Stress: Not on file  Social Connections: Not on file  Intimate Partner Violence: Not on file  Depression (PHQ2-9): Low Risk (12/26/2024)   Depression (PHQ2-9)    PHQ-2 Score: 0  Alcohol Screen: Not on file  Housing: Not on file  Utilities: Not on file  Health Literacy: Not on file    Review of Systems  Constitutional:  Negative for chills and fever.  HENT:  Negative for congestion and sore throat.   Eyes:  Negative for blurred vision.  Respiratory:  Negative for cough, sputum production and shortness of breath.   Cardiovascular:  Negative for chest pain, palpitations and leg swelling.  Gastrointestinal:  Negative for abdominal pain, heartburn and nausea.   Genitourinary:  Negative for dysuria, frequency and hematuria.  Musculoskeletal:  Negative for falls and myalgias.  Neurological:  Negative for dizziness, sensory change and focal weakness.  Psychiatric/Behavioral:  Negative for depression. The patient is nervous/anxious.      Objective:    BP 113/79   Pulse 69   Temp 98 F (36.7 C) (Oral)   Ht 5' 8 (1.727 m)   Wt 204 lb (92.5 kg)   SpO2 96%   BMI 31.02 kg/m   Physical Exam Constitutional:      Appearance: Normal appearance.  HENT:     Head: Normocephalic and atraumatic.     Left Ear: Tympanic membrane normal.     Mouth/Throat:     Pharynx: No posterior oropharyngeal erythema.  Cardiovascular:     Rate and Rhythm: Normal rate and regular rhythm.  Pulses: Normal pulses.     Heart sounds: Normal heart sounds.  Pulmonary:     Effort: No respiratory distress.     Breath sounds: No stridor. No wheezing or rales.  Abdominal:     General: Bowel sounds are normal. There is no distension.     Palpations: Abdomen is soft.     Tenderness: There is no abdominal tenderness. There is no right CVA tenderness or guarding.  Musculoskeletal:        General: No swelling.  Lymphadenopathy:     Cervical: No cervical adenopathy.  Neurological:     Mental Status: He is alert. Mental status is at baseline.     Sensory: No sensory deficit.     Motor: No weakness.     Comments: Gait normal EOMI No nystagmus Strength and sensations intact  Finger nose neg     Last CBC Lab Results  Component Value Date   WBC 5.1 02/19/2024   HGB 13.9 02/19/2024   HCT 42.6 02/19/2024   MCV 89 02/19/2024   MCH 29.0 02/19/2024   RDW 12.2 02/19/2024   PLT 257 02/19/2024   Last metabolic panel Lab Results  Component Value Date   GLUCOSE 97 02/19/2024   NA 139 02/19/2024   K 4.2 02/19/2024   CL 103 02/19/2024   CO2 23 02/19/2024   BUN 13 02/19/2024   CREATININE 0.95 02/19/2024   EGFR 99 02/19/2024   CALCIUM  9.2 02/19/2024   PROT 6.5  02/19/2024   ALBUMIN 4.2 02/19/2024   LABGLOB 2.3 02/19/2024   AGRATIO 1.7 04/21/2023   BILITOT 0.3 02/19/2024   ALKPHOS 78 02/19/2024   AST 18 02/19/2024   ALT 22 02/19/2024   ANIONGAP 12 03/18/2017   Last lipids Lab Results  Component Value Date   CHOL 147 02/19/2024   HDL 33 (L) 02/19/2024   LDLCALC 69 02/19/2024   TRIG 277 (H) 02/19/2024   CHOLHDL 4.5 02/19/2024   Last hemoglobin A1c Lab Results  Component Value Date   HGBA1C 5.2 02/19/2024   Last thyroid functions Lab Results  Component Value Date   TSH 0.992 01/20/2018   Last vitamin D  Lab Results  Component Value Date   VD25OH 56.6 02/19/2024   Last vitamin B12 and Folate No results found for: VITAMINB12, FOLATE     Assessment & Plan:   Assessment & Plan GAD (generalized anxiety disorder) GAD 13  Will switch prozac to effexor  Follow up in 4 weeks Orders:   CBC with Differential/Platelet; Future   COMPLETE METABOLIC PANEL WITHOUT GFR; Future  Primary hypertension At goal  Cont with toprol  DAS diet  Exercise regularly Orders:   COMPLETE METABOLIC PANEL WITHOUT GFR; Future   metoprolol  succinate (TOPROL -XL) 25 MG 24 hr tablet; Take 1 tablet (25 mg total) by mouth daily.  Hyperlipidemia, unspecified hyperlipidemia type Will check lipid panel Orders:   Lipid panel; Future Orders:   rosuvastatin  (CRESTOR ) 20 MG tablet; Take 1 tablet (20 mg total) by mouth daily.   Lipid panel; Future  Screening for diabetes mellitus  Orders:   Hemoglobin A1c; Future  Screening for thyroid disorder  Orders:   TSH; Future  OSA on CPAP Cont with CPAP Orders:   CBC with Differential/Platelet; Future  Screening for STD (sexually transmitted disease)  Orders:   HIV antibody (with reflex); Future   Hepatitis C Antibody; Future   Return in about 4 weeks (around 01/23/2025).   Jackalyn Blazing, MD [facility]    "

## 2024-12-26 NOTE — Patient Instructions (Signed)

## 2024-12-27 ENCOUNTER — Other Ambulatory Visit (HOSPITAL_COMMUNITY): Payer: Self-pay

## 2024-12-27 ENCOUNTER — Other Ambulatory Visit: Payer: Self-pay

## 2024-12-28 ENCOUNTER — Other Ambulatory Visit (HOSPITAL_COMMUNITY): Payer: Self-pay

## 2024-12-31 ENCOUNTER — Other Ambulatory Visit (INDEPENDENT_AMBULATORY_CARE_PROVIDER_SITE_OTHER)

## 2024-12-31 DIAGNOSIS — I10 Essential (primary) hypertension: Secondary | ICD-10-CM

## 2024-12-31 DIAGNOSIS — Z131 Encounter for screening for diabetes mellitus: Secondary | ICD-10-CM | POA: Diagnosis not present

## 2024-12-31 DIAGNOSIS — Z1329 Encounter for screening for other suspected endocrine disorder: Secondary | ICD-10-CM | POA: Diagnosis not present

## 2024-12-31 DIAGNOSIS — E785 Hyperlipidemia, unspecified: Secondary | ICD-10-CM

## 2024-12-31 DIAGNOSIS — G4733 Obstructive sleep apnea (adult) (pediatric): Secondary | ICD-10-CM

## 2024-12-31 DIAGNOSIS — Z113 Encounter for screening for infections with a predominantly sexual mode of transmission: Secondary | ICD-10-CM

## 2024-12-31 DIAGNOSIS — F411 Generalized anxiety disorder: Secondary | ICD-10-CM

## 2024-12-31 LAB — CBC WITH DIFFERENTIAL/PLATELET
Basophils Absolute: 0.1 K/uL (ref 0.0–0.1)
Basophils Relative: 2 % (ref 0.0–3.0)
Eosinophils Absolute: 0.1 K/uL (ref 0.0–0.7)
Eosinophils Relative: 1.7 % (ref 0.0–5.0)
HCT: 42.3 % (ref 39.0–52.0)
Hemoglobin: 13.9 g/dL (ref 13.0–17.0)
Lymphocytes Relative: 33.7 % (ref 12.0–46.0)
Lymphs Abs: 1.6 K/uL (ref 0.7–4.0)
MCHC: 32.8 g/dL (ref 30.0–36.0)
MCV: 91 fl (ref 78.0–100.0)
Monocytes Absolute: 0.4 K/uL (ref 0.1–1.0)
Monocytes Relative: 8.7 % (ref 3.0–12.0)
Neutro Abs: 2.5 K/uL (ref 1.4–7.7)
Neutrophils Relative %: 53.9 % (ref 43.0–77.0)
Platelets: 251 K/uL (ref 150.0–400.0)
RBC: 4.65 Mil/uL (ref 4.22–5.81)
RDW: 13.2 % (ref 11.5–15.5)
WBC: 4.7 K/uL (ref 4.0–10.5)

## 2024-12-31 LAB — LIPID PANEL
Cholesterol: 113 mg/dL (ref 28–200)
HDL: 28.3 mg/dL — ABNORMAL LOW
LDL Cholesterol: 40 mg/dL (ref 10–99)
NonHDL: 85.14
Total CHOL/HDL Ratio: 4
Triglycerides: 227 mg/dL — ABNORMAL HIGH (ref 10.0–149.0)
VLDL: 45.4 mg/dL — ABNORMAL HIGH (ref 0.0–40.0)

## 2024-12-31 LAB — TSH: TSH: 0.87 u[IU]/mL (ref 0.35–5.50)

## 2024-12-31 LAB — HEMOGLOBIN A1C: Hgb A1c MFr Bld: 5.9 % (ref 4.6–6.5)

## 2025-01-01 ENCOUNTER — Ambulatory Visit: Payer: Self-pay | Admitting: Sports Medicine

## 2025-01-01 LAB — COMPLETE METABOLIC PANEL WITHOUT GFR
AG Ratio: 2 (calc) (ref 1.0–2.5)
ALT: 29 U/L (ref 9–46)
AST: 22 U/L (ref 10–40)
Albumin: 4.1 g/dL (ref 3.6–5.1)
Alkaline phosphatase (APISO): 61 U/L (ref 36–130)
BUN: 12 mg/dL (ref 7–25)
CO2: 30 mmol/L (ref 20–32)
Calcium: 9 mg/dL (ref 8.6–10.3)
Chloride: 108 mmol/L (ref 98–110)
Creat: 1.04 mg/dL (ref 0.60–1.29)
Globulin: 2.1 g/dL (ref 1.9–3.7)
Glucose, Bld: 92 mg/dL (ref 65–99)
Potassium: 4.4 mmol/L (ref 3.5–5.3)
Sodium: 143 mmol/L (ref 135–146)
Total Bilirubin: 0.3 mg/dL (ref 0.2–1.2)
Total Protein: 6.2 g/dL (ref 6.1–8.1)

## 2025-01-01 LAB — HIV ANTIBODY (ROUTINE TESTING W REFLEX)
HIV 1&2 Ab, 4th Generation: NONREACTIVE
HIV FINAL INTERPRETATION: NEGATIVE

## 2025-01-01 LAB — HEPATITIS C ANTIBODY: Hepatitis C Ab: NONREACTIVE

## 2025-01-01 NOTE — Telephone Encounter (Signed)
 Isaac [name]

## 2025-01-08 ENCOUNTER — Telehealth: Payer: Self-pay

## 2025-01-08 NOTE — Telephone Encounter (Signed)
 SABRA

## 2025-01-09 ENCOUNTER — Encounter: Payer: Self-pay | Admitting: Cardiology

## 2025-01-09 ENCOUNTER — Ambulatory Visit: Attending: Cardiology | Admitting: Cardiology

## 2025-01-09 VITALS — BP 118/78 | HR 77 | Ht 67.0 in | Wt 205.8 lb

## 2025-01-09 DIAGNOSIS — G4733 Obstructive sleep apnea (adult) (pediatric): Secondary | ICD-10-CM | POA: Diagnosis not present

## 2025-01-09 NOTE — Progress Notes (Addendum)
 "     SLEEP OFFICE VISIT NOTE:    Date:  01/09/2025   ID:  Isaac [name], DOB 31-Aug-1976, MRN [id number]   PCP:  Sherlynn Madden, MD  Cardiologist:  Debby Sor, MD (Inactive)     Chief Complaint  Patient presents with   Sleep Apnea    History of Present Illness:    Isaac [name] is a 49 y.o. male with a hx of anxiety, depression, CAD who was referred to Dr. Sor for evaluation for sleep apnea.  He underwent home sleep study 03/2023 and was found to have severe obstructive sleep apnea with an AHI of 30.1/h with a supine AHI of 30.6/h.  He was set up on a ResMed AirSense 11 auto CPAP from 6 to 18 cm H2O.  He was last seen by Dr. Sor on 09/20/2023 and was doing well.   He is now referred to establish sleep care upon Dr. Joesphine retirement.  He is doing well with his PAP device.  He tolerates the nasal pillow mask and feels the pressure is adequate.  He feels rested in the am for the most part but still has daytime sleepiness.  He dose sleep better with CPAP and wakes up feeling better but still can fall asleep if he is sitting not doing anything.  He goes to bed at 10PM and falls asleep within 30-40 minutes if he is reading before going to sleep.  Otherwise if he does not read it takes him 2 hours to get to sleep.  Occasionally he will wake up at night but immediately goes back to sleep.  He gets up at 6AM.  He denies any significant mouth or nasal dryness or nasal congestion.  He does not think that he snores. An Epworth Sleepiness Scale score was calculated the office today and this endorsed at 13 indicating some residual daytime sleepiness. Patient denies any episodes of bruxism, restless legs, No hypnogognic hallucinations or cataplectic events.    Past Medical History:  Diagnosis Date   Anxiety    Depression with anxiety 02/03/2020   Kidney stones    OSA (obstructive sleep apnea) 02/19/2024   Sleep apnea in adult     Past Surgical History:  Procedure Laterality Date    COLONOSCOPY N/A 08/02/2024   Procedure: COLONOSCOPY;  Surgeon: Cindie Carlin POUR, DO;  [facility];  Service: Endoscopy;  Laterality: N/A;  730am, asa 2   ELBOW SURGERY Left    nerve release    EXTRACORPOREAL SHOCK WAVE LITHOTRIPSY Left 04/13/2017   Procedure: LEFT EXTRACORPOREAL SHOCK WAVE LITHOTRIPSY (ESWL);  Surgeon: Arlena Gal, MD;  Location: [facility];  Service: Urology;  Laterality: Left;   WISDOM TOOTH EXTRACTION  1995    Current Medications: Active Medications[1]   Allergies:   Patient has no known allergies.   Social History   Socioeconomic History   Marital status: Married    Spouse name: Not on file   Number of children: 3   Years of education: 14   Highest education level: Not on file  Occupational History   Occupation: computer crimes international aid/development worker    Employer: [employer]  Tobacco Use   Smoking status: Former   Smokeless tobacco: Former    Types: Chew   Tobacco comments:    Quit 20 yrs ago  Advertising Account Planner   Vaping status: Never Used  Substance and Sexual Activity   Alcohol use: No   Drug use: No   Sexual activity: Yes  Partners: Female  Other Topics Concern   Not on file  Social History Narrative   Lives with wife and 3 children in a one story home.  Works as a investment banker, corporate.  Education: some college.    Social Drivers of Health   Tobacco Use: Medium Risk (01/09/2025)   Patient History    Smoking Tobacco Use: Former    Smokeless Tobacco Use: Former    Passive Exposure: Not on Stage Manager: Not on file  Food Insecurity: Not on file  Transportation Needs: Not on file  Physical Activity: Not on file  Stress: Not on file  Social Connections: Not on file  Depression (PHQ2-9): Low Risk (12/26/2024)   Depression (PHQ2-9)    PHQ-2 Score: 0  Alcohol Screen: Not on file  Housing: Not on file  Utilities: Not on file  Health Literacy: Not on file     Family History: The patient's family history  includes Anxiety disorder in his daughter; Asthma in his brother and daughter; Diabetes in his father, paternal grandfather, and paternal grandmother; Hearing loss in his maternal grandfather; Heart disease in his father, paternal grandfather, and paternal grandmother; Hyperlipidemia in his father, paternal grandfather, and paternal grandmother; Hypertension in his father, paternal grandfather, and paternal grandmother.  ROS:   Please see the history of present illness.    ROS  All other systems reviewed and negative.   EKGs/Labs/Other Studies Reviewed:    The following studies were reviewed today: PAP compliance download   Physical Exam:    VS:  BP 118/78   Pulse 77   Ht 5' 7 (1.702 m)   Wt 205 lb 12.8 oz (93.4 kg)   SpO2 98%   BMI 32.23 kg/m     Wt Readings from Last 3 Encounters:  01/09/25 205 lb 12.8 oz (93.4 kg)  12/26/24 204 lb (92.5 kg)  08/02/24 186 lb (84.4 kg)    GEN: Well nourished, well developed in no acute distress HEENT: Normal NECK: No JVD; No carotid bruits LYMPHATICS: No lymphadenopathy CARDIAC:RRR, no murmurs, rubs, gallops RESPIRATORY:  Clear to auscultation without rales, wheezing or rhonchi  ABDOMEN: Soft, non-tender, non-distended MUSCULOSKELETAL:  No edema; No deformity  SKIN: Warm and dry NEUROLOGIC:  Alert and oriented x 3 PSYCHIATRIC:  Normal affect   ASSESSMENT:    1. OSA (obstructive sleep apnea)    PLAN:    In order of problems listed above:  OSA - The patient is tolerating PAP therapy well without any problems. The PAP download performed by his DME was personally reviewed and interpreted by me today and showed an AHI of 1.3 /hr on auto CPAP from 6-18 cm H2O with 90% compliance in using more than 4 hours nightly.  The patient has been using and benefiting from PAP use and will continue to benefit from therapy.  -Encouraged him to try to get at least 7-8 hours.   Time Spent: 20 minutes total time of encounter, including 15 minutes  spent in face-to-face patient care on the date of this encounter. This time includes coordination of care and counseling regarding above mentioned problem list. Remainder of non-face-to-face time involved reviewing chart documents/testing relevant to the patient encounter and documentation in the medical record. I have independently reviewed documentation from referring provider  Medication Adjustments/Labs and Tests Ordered: Current medicines are reviewed at length with the patient today.  Concerns regarding medicines are outlined above.  No orders of the defined types were placed in this encounter.  No  orders of the defined types were placed in this encounter.   Signed, Wilbert Bihari, MD  01/09/2025 8:56 AM    [facility]     [1]  Current Meds  Medication Sig   metoprolol  succinate (TOPROL -XL) 25 MG 24 hr tablet Take 1 tablet (25 mg total) by mouth daily.   rosuvastatin  (CRESTOR ) 20 MG tablet Take 1 tablet (20 mg total) by mouth daily.   venlafaxine  XR (EFFEXOR  XR) 75 MG 24 hr capsule Take 1 capsule (75 mg total) by mouth daily with breakfast.   "

## 2025-01-09 NOTE — Patient Instructions (Signed)

## 2025-01-16 ENCOUNTER — Other Ambulatory Visit (HOSPITAL_COMMUNITY): Payer: Self-pay

## 2025-01-17 ENCOUNTER — Other Ambulatory Visit: Payer: Self-pay

## 2025-01-20 ENCOUNTER — Encounter: Admitting: Family Medicine

## 2025-01-28 ENCOUNTER — Ambulatory Visit: Admitting: Sports Medicine

## 2025-02-20 ENCOUNTER — Encounter: Payer: Self-pay | Admitting: Family Medicine
